# Patient Record
Sex: Female | Born: 2000 | Hispanic: Yes | Marital: Single | Smoking: Never smoker
Health system: Southern US, Community
[De-identification: ages and names within clinical notes are randomized; demographics above are authoritative.]

## PROBLEM LIST (undated history)

## (undated) ENCOUNTER — Inpatient Hospital Stay (HOSPITAL_COMMUNITY): Payer: Self-pay

## (undated) ENCOUNTER — Emergency Department (HOSPITAL_COMMUNITY): Admission: EM | Payer: Self-pay | Source: Home / Self Care

## (undated) DIAGNOSIS — T7840XA Allergy, unspecified, initial encounter: Secondary | ICD-10-CM

## (undated) DIAGNOSIS — N390 Urinary tract infection, site not specified: Secondary | ICD-10-CM

---
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
## 2020-01-14 ENCOUNTER — Emergency Department (HOSPITAL_COMMUNITY): Payer: Self-pay

## 2020-01-14 ENCOUNTER — Emergency Department (HOSPITAL_COMMUNITY)
Admission: EM | Admit: 2020-01-14 | Discharge: 2020-01-14 | Disposition: A | Discharge status: Home / Self Care | Payer: Self-pay | Attending: Emergency Medicine | Admitting: Emergency Medicine

## 2020-01-14 ENCOUNTER — Other Ambulatory Visit: Payer: Self-pay

## 2020-01-14 DIAGNOSIS — Z79899 Other long term (current) drug therapy: Secondary | ICD-10-CM | POA: Insufficient documentation

## 2020-01-14 DIAGNOSIS — S0083XA Contusion of other part of head, initial encounter: Secondary | ICD-10-CM

## 2020-01-14 DIAGNOSIS — Y939 Activity, unspecified: Secondary | ICD-10-CM | POA: Insufficient documentation

## 2020-01-14 DIAGNOSIS — Y999 Unspecified external cause status: Secondary | ICD-10-CM | POA: Insufficient documentation

## 2020-01-14 DIAGNOSIS — Y92009 Unspecified place in unspecified non-institutional (private) residence as the place of occurrence of the external cause: Secondary | ICD-10-CM | POA: Insufficient documentation

## 2020-01-14 MED ORDER — IBUPROFEN 800 MG PO TABS: 800.0000 mg | ORAL_TABLET | Freq: Three times a day (TID) | ORAL | 0 refills | Status: DC

## 2020-01-14 MED ORDER — KETOROLAC TROMETHAMINE 60 MG/2ML IM SOLN
60.0000 mg | Freq: Once | INTRAMUSCULAR | Status: AC
Start: 2020-01-14 — End: 2020-01-14
  Administered 2020-01-14: 60 mg via INTRAMUSCULAR
  Filled 2020-01-14: qty 2
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
## 2020-10-25 ENCOUNTER — Other Ambulatory Visit: Payer: Self-pay

## 2020-10-25 DIAGNOSIS — Z20822 Contact with and (suspected) exposure to covid-19: Secondary | ICD-10-CM

## 2020-10-26 LAB — NOVEL CORONAVIRUS, NAA: SARS-CoV-2, NAA: NOT DETECTED

## 2020-10-26 LAB — SARS-COV-2, NAA 2 DAY TAT

## 2020-11-24 ENCOUNTER — Ambulatory Visit: Payer: Self-pay

## 2020-11-24 DIAGNOSIS — Z20822 Contact with and (suspected) exposure to covid-19: Secondary | ICD-10-CM

## 2020-11-25 LAB — SARS-COV-2, NAA 2 DAY TAT

## 2020-11-25 LAB — NOVEL CORONAVIRUS, NAA: SARS-CoV-2, NAA: NOT DETECTED

## 2020-12-27 ENCOUNTER — Emergency Department (HOSPITAL_COMMUNITY)
Admission: EM | Admit: 2020-12-27 | Discharge: 2020-12-27 | Disposition: A | Discharge status: Home / Self Care | Payer: HRSA Program | Attending: Emergency Medicine | Admitting: Emergency Medicine

## 2020-12-27 ENCOUNTER — Other Ambulatory Visit: Payer: Self-pay

## 2020-12-27 ENCOUNTER — Encounter (HOSPITAL_COMMUNITY): Payer: Self-pay

## 2020-12-27 ENCOUNTER — Emergency Department (HOSPITAL_COMMUNITY)
Admission: EM | Admit: 2020-12-27 | Discharge: 2020-12-27 | Disposition: A | Discharge status: Home / Self Care | Payer: Self-pay | Attending: Emergency Medicine | Admitting: Emergency Medicine

## 2020-12-27 DIAGNOSIS — R6883 Chills (without fever): Secondary | ICD-10-CM | POA: Insufficient documentation

## 2020-12-27 DIAGNOSIS — Z20822 Contact with and (suspected) exposure to covid-19: Secondary | ICD-10-CM | POA: Diagnosis not present

## 2020-12-27 DIAGNOSIS — J029 Acute pharyngitis, unspecified: Secondary | ICD-10-CM | POA: Diagnosis present

## 2020-12-27 DIAGNOSIS — Z87440 Personal history of urinary (tract) infections: Secondary | ICD-10-CM | POA: Insufficient documentation

## 2020-12-27 DIAGNOSIS — R070 Pain in throat: Secondary | ICD-10-CM | POA: Insufficient documentation

## 2020-12-27 DIAGNOSIS — R519 Headache, unspecified: Secondary | ICD-10-CM | POA: Insufficient documentation

## 2020-12-27 DIAGNOSIS — R0789 Other chest pain: Secondary | ICD-10-CM | POA: Diagnosis not present

## 2020-12-27 DIAGNOSIS — R059 Cough, unspecified: Secondary | ICD-10-CM | POA: Insufficient documentation

## 2020-12-27 DIAGNOSIS — Z5321 Procedure and treatment not carried out due to patient leaving prior to being seen by health care provider: Secondary | ICD-10-CM | POA: Insufficient documentation

## 2020-12-27 LAB — SARS CORONAVIRUS 2 (TAT 6-24 HRS): SARS Coronavirus 2: NEGATIVE

## 2020-12-27 MED ORDER — ALBUTEROL SULFATE HFA 108 (90 BASE) MCG/ACT IN AERS
2.0000 | INHALATION_SPRAY | Freq: Once | RESPIRATORY_TRACT | Status: AC
  Administered 2020-12-27: 2 via RESPIRATORY_TRACT
  Filled 2020-12-27: qty 6.7

## 2020-12-27 NOTE — ED Triage Notes (Signed)
Patient has had a headache, sore throat since last night. Endorses dizziness with change of position. Denies body aches or cough, N/V/D.

## 2020-12-27 NOTE — Discharge Instructions (Addendum)
Your COVID-19 test should result sometime tomorrow afternoon.  Until then you must isolate.  If your COVID-19 test is positive that he will have to isolate for 5 days, and he can enter the public with a mask for another 5 days.  Take Tylenol and Motrin for your headaches. Use the albuterol inhaler for chest tightness. Return to the ER if your chest pain, shortness of breath gets worse.  Return to the ER if you start any confusion, loss of consciousness.
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
## 2020-12-27 NOTE — ED Triage Notes (Signed)
Pt reports headache, sore throat for 3 days. No covid vaccines

## 2020-12-27 NOTE — ED Notes (Signed)
Pt called for bed x3 with no response. 

## 2020-12-28 ENCOUNTER — Emergency Department (HOSPITAL_COMMUNITY): Payer: HRSA Program

## 2020-12-28 ENCOUNTER — Emergency Department (HOSPITAL_COMMUNITY)
Admission: EM | Admit: 2020-12-28 | Discharge: 2020-12-28 | Disposition: A | Discharge status: Home / Self Care | Payer: HRSA Program | Attending: Emergency Medicine | Admitting: Emergency Medicine

## 2020-12-28 ENCOUNTER — Other Ambulatory Visit: Payer: Self-pay

## 2020-12-28 ENCOUNTER — Encounter (HOSPITAL_COMMUNITY): Payer: Self-pay | Admitting: Emergency Medicine

## 2020-12-28 DIAGNOSIS — R509 Fever, unspecified: Secondary | ICD-10-CM | POA: Insufficient documentation

## 2020-12-28 DIAGNOSIS — R059 Cough, unspecified: Secondary | ICD-10-CM | POA: Insufficient documentation

## 2020-12-28 DIAGNOSIS — Z8669 Personal history of other diseases of the nervous system and sense organs: Secondary | ICD-10-CM | POA: Insufficient documentation

## 2020-12-28 DIAGNOSIS — R072 Precordial pain: Secondary | ICD-10-CM | POA: Insufficient documentation

## 2020-12-28 DIAGNOSIS — R000 Tachycardia, unspecified: Secondary | ICD-10-CM | POA: Insufficient documentation

## 2020-12-28 DIAGNOSIS — Z20822 Contact with and (suspected) exposure to covid-19: Secondary | ICD-10-CM | POA: Diagnosis not present

## 2020-12-28 DIAGNOSIS — R519 Headache, unspecified: Secondary | ICD-10-CM | POA: Diagnosis not present

## 2020-12-28 LAB — I-STAT BETA HCG BLOOD, ED (MC, WL, AP ONLY): I-stat hCG, quantitative: <5 m[IU]/mL (ref <5)

## 2020-12-28 LAB — BASIC METABOLIC PANEL
Anion gap: 11 (ref 5–15)
BUN: 8 mg/dL (ref 6–20)
CO2: 23 mmol/L (ref 22–32)
Calcium: 8.7 mg/dL — ABNORMAL LOW (ref 8.9–10.3)
Chloride: 104 mmol/L (ref 98–111)
Creatinine, Ser: 0.83 mg/dL (ref 0.44–1.00)
GFR, Estimated: >60 mL/min (ref >60)
Glucose, Bld: 109 mg/dL — ABNORMAL HIGH (ref 70–99)
Potassium: 3.5 mmol/L (ref 3.5–5.1)
Sodium: 138 mmol/L (ref 135–145)

## 2020-12-28 LAB — CBC WITH DIFFERENTIAL/PLATELET
Abs Immature Granulocytes: 0.01 10*3/uL (ref 0.00–0.07)
Basophils Absolute: 0 10*3/uL (ref 0.0–0.1)
Basophils Relative: 0 %
Eosinophils Absolute: 0.1 10*3/uL (ref 0.0–0.5)
Eosinophils Relative: 1 %
HCT: 37.8 % (ref 36.0–46.0)
Hemoglobin: 13.1 g/dL (ref 12.0–15.0)
Immature Granulocytes: 0 %
Lymphocytes Relative: 12 %
Lymphs Abs: 0.7 10*3/uL (ref 0.7–4.0)
MCH: 30.5 pg (ref 26.0–34.0)
MCHC: 34.7 g/dL (ref 30.0–36.0)
MCV: 88.1 fL (ref 80.0–100.0)
Monocytes Absolute: 0.8 10*3/uL (ref 0.1–1.0)
Monocytes Relative: 13 %
Neutro Abs: 4.2 10*3/uL (ref 1.7–7.7)
Neutrophils Relative %: 74 %
Platelets: 248 10*3/uL (ref 150–400)
RBC: 4.29 MIL/uL (ref 3.87–5.11)
RDW: 12.9 % (ref 11.5–15.5)
WBC: 5.8 10*3/uL (ref 4.0–10.5)
nRBC: 0 % (ref 0.0–0.2)

## 2020-12-28 LAB — SARS CORONAVIRUS 2 (TAT 6-24 HRS)
SARS Coronavirus 2: NEGATIVE
SARS Coronavirus 2: NEGATIVE

## 2020-12-28 LAB — TROPONIN I (HIGH SENSITIVITY): Troponin I (High Sensitivity): <2 ng/L (ref <18)

## 2020-12-28 MED ORDER — KETOROLAC TROMETHAMINE 30 MG/ML IJ SOLN
INTRAMUSCULAR | Status: AC
  Administered 2020-12-28: 60 mg via INTRAMUSCULAR
  Filled 2020-12-28: qty 1

## 2020-12-28 MED ORDER — DEXAMETHASONE SODIUM PHOSPHATE 10 MG/ML IJ SOLN
10.0000 mg | Freq: Once | INTRAMUSCULAR | Status: AC
  Administered 2020-12-28: 10 mg via INTRAVENOUS
  Filled 2020-12-28: qty 1

## 2020-12-28 MED ORDER — KETOROLAC TROMETHAMINE 30 MG/ML IJ SOLN
60.0000 mg | Freq: Once | INTRAMUSCULAR | Status: AC
  Filled 2020-12-28: qty 2

## 2020-12-28 MED ORDER — SODIUM CHLORIDE 0.9 % IV BOLUS
1000.0000 mL | Freq: Once | INTRAVENOUS | Status: AC
  Administered 2020-12-28: 1000 mL via INTRAVENOUS

## 2020-12-28 NOTE — ED Triage Notes (Signed)
Per [facility], Pt, from home, c/o headache x1 day and chest pain w/ movement and coughing starting this morning.  Pain score 9/10.    Pt was seen yesterday for same.  COVID-.

## 2020-12-28 NOTE — ED Notes (Addendum)
On 1/18, pt reports feeling "migrane and a fever." Sx progressively worsened since then. Returned to ED today because "when I tried to get up this morning, my chest hurt, like I could feel it. And, I had a fever last night of 103. And, my migraine hasn't gone away since the 18th." The last time she took tylenol 500mg  was 1/19 at 12 PM. Pt is not COVID-19 vaccinated. States she plans to get the vaccine.

## 2020-12-28 NOTE — Discharge Instructions (Addendum)
You have been tested for Covid.  Your symptoms could be related to Covid infection.  We recommend that you self isolate and quarantine until you receive the results of this test.  Treat yourself symptomatically, you may take an alternate Tylenol and ibuprofen for fever/headache control.  Stay well-hydrated.   If you have any worsening symptoms, severe chest pain, difficulty breathing or further concerns for your health please return to the emergency department for further evaluation.

## 2020-12-28 NOTE — ED Provider Notes (Signed)
[facility]-EMERGENCY DEPT Provider Note   CSN: [id number] Arrival date & time: 12/28/20  7672     History Chief Complaint  Patient presents with  . Chest Pain  . Headache    Alexandria [name] is a 20 y.o. female.  HPI   20 year old female with past medical history of migraines presents the emergency department with concern for general illness.  Patient states 2 days ago she developed fever, headache, dry cough.  She did have a recent exposure with a COVID-positive person.  Patient was seen here yesterday for generalized illness, initial swab for COVID was negative.  Patient returns today with ongoing symptoms, fever and cough.  Patient states she got a migraine at the beginning of the symptoms and it has not gone away, she is also not taking any medication for the headache.  Patient took 1 dose of Tylenol yesterday but has not been consistently treating herself symptomatically.  Denies any vomiting/diarrhea/swelling of her lower extremities.  Patient states when she has coughing spells she does get some midsternal chest tightness but otherwise denies any chest pain, shortness of breath, difficulty breathing.  Past Medical History:  Diagnosis Date  . Allergy   . Urinary tract infection     Patient Active Problem List   Diagnosis Date Noted  . Cesarean delivery delivered 04/01/2018  . Engagement of fetus in breech position 03/30/2018  . Rupture of membranes with delay of delivery 03/30/2018  . Breech birth 03/30/2018    Past Surgical History:  Procedure Laterality Date  . CESAREAN SECTION N/A 03/30/2018   Procedure: CESAREAN SECTION;  Surgeon: Conan Bowens, MD;  Location: [facility] BIRTHING SUITES;  Service: Obstetrics;  Laterality: N/A;     OB History    Gravida  1   Para  1   Term  1   Preterm      AB      Living  1     SAB      IAB      Ectopic      Multiple  0   Live Births  1           History reviewed. No pertinent family  history.  Social History   Tobacco Use  . Smoking status: Never Smoker  . Smokeless tobacco: Never Used  Vaping Use  . Vaping Use: Never used  Substance Use Topics  . Alcohol use: No  . Drug use: No    Home Medications Prior to Admission medications   Medication Sig Start Date End Date Taking? Authorizing Provider  acetaminophen (TYLENOL) 500 MG tablet Take 500 mg by mouth every 6 (six) hours as needed for moderate pain.    [provider]  hydrocortisone (ANUSOL-HC) 2.5 % rectal cream Apply rectally 2 times daily Patient not taking: No sig reported 05/18/18   Eustace Moore, MD  ibuprofen (ADVIL) 800 MG tablet Take 1 tablet (800 mg total) by mouth 3 (three) times daily. Patient not taking: No sig reported 01/14/20   Arby Barrette, MD    Allergies    Other  Review of Systems   Review of Systems  Constitutional: Positive for fatigue and fever. Negative for chills.  HENT: Positive for congestion.   Eyes: Negative for visual disturbance.  Respiratory: Positive for cough and chest tightness. Negative for shortness of breath.   Cardiovascular: Negative for chest pain, palpitations and leg swelling.  Gastrointestinal: Negative for abdominal pain, diarrhea and vomiting.  Genitourinary: Negative for dysuria.  Skin: Negative for rash.  Neurological: Positive for headaches.    Physical Exam Updated Vital Signs BP 117/79   Pulse (!) 104   Temp 100 F (37.8 C) (Oral)   Resp 17   Ht 4\' 11"  (1.499 m)   Wt 59 kg   SpO2 100%   BMI 26.26 kg/m   Physical Exam Vitals and nursing note reviewed.  Constitutional:      General: She is not in acute distress.    Appearance: Normal appearance. She is not ill-appearing, toxic-appearing or diaphoretic.  HENT:     Head: Normocephalic.     Mouth/Throat:     Mouth: Mucous membranes are moist.  Cardiovascular:     Rate and Rhythm: Tachycardia present.  Pulmonary:     Effort: Accessory muscle usage present. No tachypnea or  respiratory distress.     Breath sounds: No decreased breath sounds, wheezing or rales.  Chest:     Chest wall: No tenderness or crepitus.  Abdominal:     Palpations: Abdomen is soft.     Tenderness: There is no abdominal tenderness.  Musculoskeletal:     Right lower leg: No edema.     Left lower leg: No edema.  Skin:    General: Skin is warm.  Neurological:     Mental Status: She is alert and oriented to person, place, and time. Mental status is at baseline.  Psychiatric:        Mood and Affect: Mood normal.     ED Results / Procedures / Treatments   Labs (all labs ordered are listed, but only abnormal results are displayed) Labs Reviewed  CBC WITH DIFFERENTIAL/PLATELET  BASIC METABOLIC PANEL  I-STAT BETA HCG BLOOD, ED ([facility], [facility], [facility] ONLY)  TROPONIN I (HIGH SENSITIVITY)    EKG None  Radiology No results found.  Procedures Procedures (including critical care time)  Medications Ordered in ED Medications  ketorolac (TORADOL) 30 MG/ML injection 60 mg (has no administration in time range)  dexamethasone (DECADRON) injection 10 mg (has no administration in time range)  sodium chloride 0.9 % bolus 1,000 mL (has no administration in time range)    ED Course  I have reviewed the triage vital signs and the nursing notes.  Pertinent labs & imaging results that were available during my care of the patient were reviewed by me and considered in my medical decision making (see chart for details).    MDM Rules/Calculators/A&P                          20 year old female presents the emergency department with day 2 of generalized illness.  COVID swab from yesterday was negative, will repeat today because high suspicion for COVID.  Patient is febrile and tachycardic on arrival.  EKG shows sinus tachycardia, no ischemic changes.  No active chest pain or respiratory distress.  Main complaint is ongoing migraine and fever, has not taken any over-the-counter medication.  We will treat  symptomatically, evaluate with labs, hydrate.  Low suspicion for PE at this time given no hypoxia or active chest pain, tachycardia most likely related to fever.  After fluids and medication patient feels significantly improved.  Fever has reduced.  Blood work shows no acute abnormality, cardiac work-up is negative.  Low suspicion for ischemic cardiac disease, low suspicion for PE.  She is speaking in prolonged sentences, appears very comfortable.  Encouraged patient to treat herself symptomatically with over-the-counter medication.  Patient will be discharged and  treated as an outpatient.  Discharge plan and strict return to ED precautions discussed, patient verbalizes understanding and agreement.  Final Clinical Impression(s) / ED Diagnoses Final diagnoses:  None    Rx / DC Orders ED Discharge Orders    None       Rozelle Logan, DO 12/28/20 1341

## 2020-12-30 ENCOUNTER — Encounter (HOSPITAL_COMMUNITY): Payer: Self-pay

## 2020-12-30 ENCOUNTER — Other Ambulatory Visit: Payer: Self-pay

## 2020-12-30 ENCOUNTER — Emergency Department (HOSPITAL_COMMUNITY)
Admission: EM | Admit: 2020-12-30 | Discharge: 2020-12-30 | Disposition: A | Discharge status: Home / Self Care | Payer: Self-pay | Attending: Emergency Medicine | Admitting: Emergency Medicine

## 2020-12-30 DIAGNOSIS — G43909 Migraine, unspecified, not intractable, without status migrainosus: Secondary | ICD-10-CM | POA: Insufficient documentation

## 2020-12-30 DIAGNOSIS — Z5321 Procedure and treatment not carried out due to patient leaving prior to being seen by health care provider: Secondary | ICD-10-CM | POA: Insufficient documentation

## 2020-12-30 MED ORDER — ACETAMINOPHEN 325 MG PO TABS
650.0000 mg | ORAL_TABLET | Freq: Once | ORAL | Status: AC | PRN
  Administered 2020-12-30: 650 mg via ORAL
  Filled 2020-12-30: qty 2

## 2020-12-30 NOTE — ED Triage Notes (Signed)
Per EMS- Patient c/o migraine since yesterday. Patient was seen in the ED yesterday. EMS called at 0100 today, but patient was not transported. Patient c/o the same.

## 2021-02-22 ENCOUNTER — Encounter (HOSPITAL_BASED_OUTPATIENT_CLINIC_OR_DEPARTMENT_OTHER): Payer: Self-pay

## 2021-04-18 ENCOUNTER — Other Ambulatory Visit: Payer: Self-pay

## 2021-04-18 ENCOUNTER — Emergency Department (HOSPITAL_COMMUNITY)
Admission: EM | Admit: 2021-04-18 | Discharge: 2021-04-18 | Disposition: A | Discharge status: Home / Self Care | Payer: Self-pay | Attending: Emergency Medicine | Admitting: Emergency Medicine

## 2021-04-18 ENCOUNTER — Emergency Department (HOSPITAL_COMMUNITY): Payer: Self-pay

## 2021-04-18 ENCOUNTER — Encounter (HOSPITAL_COMMUNITY): Payer: Self-pay | Admitting: Emergency Medicine

## 2021-04-18 DIAGNOSIS — R110 Nausea: Secondary | ICD-10-CM | POA: Insufficient documentation

## 2021-04-18 DIAGNOSIS — R102 Pelvic and perineal pain: Secondary | ICD-10-CM | POA: Insufficient documentation

## 2021-04-18 LAB — URINALYSIS, ROUTINE W REFLEX MICROSCOPIC
Bacteria, UA: NONE SEEN
Bilirubin Urine: NEGATIVE
Glucose, UA: NEGATIVE mg/dL
Ketones, ur: NEGATIVE mg/dL
Leukocytes,Ua: NEGATIVE
Nitrite: NEGATIVE
Protein, ur: NEGATIVE mg/dL
Specific Gravity, Urine: 1.015 (ref 1.005–1.030)
pH: 5 (ref 5.0–8.0)

## 2021-04-18 LAB — COMPREHENSIVE METABOLIC PANEL
ALT: 11 U/L (ref 0–44)
AST: 17 U/L (ref 15–41)
Albumin: 4.2 g/dL (ref 3.5–5.0)
Alkaline Phosphatase: 66 U/L (ref 38–126)
Anion gap: 7 (ref 5–15)
BUN: 10 mg/dL (ref 6–20)
CO2: 26 mmol/L (ref 22–32)
Calcium: 8.7 mg/dL — ABNORMAL LOW (ref 8.9–10.3)
Chloride: 107 mmol/L (ref 98–111)
Creatinine, Ser: 0.7 mg/dL (ref 0.44–1.00)
GFR, Estimated: >60 mL/min (ref >60)
Glucose, Bld: 85 mg/dL (ref 70–99)
Potassium: 3.7 mmol/L (ref 3.5–5.1)
Sodium: 140 mmol/L (ref 135–145)
Total Bilirubin: 0.5 mg/dL (ref 0.3–1.2)
Total Protein: 6.4 g/dL — ABNORMAL LOW (ref 6.5–8.1)

## 2021-04-18 LAB — CBC
HCT: 38.3 % (ref 36.0–46.0)
Hemoglobin: 13.1 g/dL (ref 12.0–15.0)
MCH: 30.8 pg (ref 26.0–34.0)
MCHC: 34.2 g/dL (ref 30.0–36.0)
MCV: 89.9 fL (ref 80.0–100.0)
Platelets: 310 10*3/uL (ref 150–400)
RBC: 4.26 MIL/uL (ref 3.87–5.11)
RDW: 12.5 % (ref 11.5–15.5)
WBC: 7.2 10*3/uL (ref 4.0–10.5)
nRBC: 0 % (ref 0.0–0.2)

## 2021-04-18 LAB — WET PREP, GENITAL
Clue Cells Wet Prep HPF POC: NONE SEEN
Sperm: NONE SEEN
Trich, Wet Prep: NONE SEEN
Yeast Wet Prep HPF POC: NONE SEEN

## 2021-04-18 LAB — GC/CHLAMYDIA PROBE AMP (~~LOC~~) NOT AT ARMC
Chlamydia: NEGATIVE
Comment: NEGATIVE
Comment: NORMAL
Neisseria Gonorrhea: NEGATIVE

## 2021-04-18 LAB — I-STAT BETA HCG BLOOD, ED (MC, WL, AP ONLY): I-stat hCG, quantitative: <5 m[IU]/mL (ref <5)

## 2021-04-18 LAB — LIPASE, BLOOD: Lipase: 36 U/L (ref 11–51)

## 2021-04-18 MED ORDER — ONDANSETRON HCL 4 MG/2ML IJ SOLN
4.0000 mg | Freq: Once | INTRAMUSCULAR | Status: AC
  Administered 2021-04-18: 4 mg via INTRAVENOUS
  Filled 2021-04-18: qty 2

## 2021-04-18 MED ORDER — NAPROXEN 500 MG PO TABS: 500.0000 mg | ORAL_TABLET | Freq: Two times a day (BID) | ORAL | 0 refills | Status: DC | PRN

## 2021-04-18 MED ORDER — ONDANSETRON 4 MG PO TBDP: 4.0000 mg | ORAL_TABLET | Freq: Three times a day (TID) | ORAL | 0 refills | Status: DC | PRN

## 2021-04-18 MED ORDER — SODIUM CHLORIDE 0.9 % IV BOLUS
1000.0000 mL | Freq: Once | INTRAVENOUS | Status: AC
Start: 2021-04-18 — End: 2021-04-18
  Administered 2021-04-18: 1000 mL via INTRAVENOUS

## 2021-04-18 MED ORDER — KETOROLAC TROMETHAMINE 15 MG/ML IJ SOLN
15.0000 mg | Freq: Once | INTRAMUSCULAR | Status: AC
  Administered 2021-04-18: 15 mg via INTRAVENOUS
  Filled 2021-04-18: qty 1

## 2021-04-18 NOTE — ED Notes (Signed)
Patient verbalizes understanding of discharge instructions. Prescriptions reviewed. Opportunity for questioning and answers were provided. Armband removed by staff, pt discharged from ED ambulatory. ° °

## 2021-04-18 NOTE — ED Provider Notes (Signed)
[facility] EMERGENCY DEPARTMENT Provider Note   CSN: [id number] Arrival date & time: 04/18/21  0025     History Chief Complaint  Patient presents with  . Abdominal Pain    Alexandria [name] is a 20 y.o. female with a hx of prior c-section who presents to the ED with complaints of abdominal pain x 3 days. Patient reports intermittent crampy pain to the left pelvic region, no alleviating/aggravating factors, has had associated nausea & vaginal spotting. Has IUD in place and typically does not have bleeding. Denies fever, emesis, dysuria, vaginal discharge, diarrhea, or concern for STI. In a monogamous relationship.   HPI     Past Medical History:  Diagnosis Date  . Allergy   . Urinary tract infection     Patient Active Problem List   Diagnosis Date Noted  . Cesarean delivery delivered 04/01/2018  . Engagement of fetus in breech position 03/30/2018  . Rupture of membranes with delay of delivery 03/30/2018  . Breech birth 03/30/2018    Past Surgical History:  Procedure Laterality Date  . CESAREAN SECTION N/A 03/30/2018   Procedure: CESAREAN SECTION;  Surgeon: Conan Bowens, MD;  Location: [facility] BIRTHING SUITES;  Service: Obstetrics;  Laterality: N/A;     OB History    Gravida  1   Para  1   Term  1   Preterm      AB      Living  1     SAB      IAB      Ectopic      Multiple  0   Live Births  1           Family History  Problem Relation Age of Onset  . Healthy Mother   . Healthy Father     Social History   Tobacco Use  . Smoking status: Never Smoker  . Smokeless tobacco: Never Used  Vaping Use  . Vaping Use: Never used  Substance Use Topics  . Alcohol use: No  . Drug use: No    Home Medications Prior to Admission medications   Medication Sig Start Date End Date Taking? Authorizing Provider  acetaminophen (TYLENOL) 500 MG tablet Take 500 mg by mouth every 6 (six) hours as needed for moderate pain.    [provider]  hydrocortisone (ANUSOL-HC) 2.5 % rectal cream Apply rectally 2 times daily Patient not taking: No sig reported 05/18/18   Eustace Moore, MD  ibuprofen (ADVIL) 800 MG tablet Take 1 tablet (800 mg total) by mouth 3 (three) times daily. Patient not taking: No sig reported 01/14/20   Arby Barrette, MD    Allergies    Other  Review of Systems   Review of Systems  Constitutional: Negative for chills and fever.  Respiratory: Negative for shortness of breath.   Cardiovascular: Negative for chest pain.  Gastrointestinal: Positive for abdominal pain and nausea. Negative for constipation, diarrhea and vomiting.  Genitourinary: Positive for vaginal bleeding. Negative for dysuria and vaginal discharge.  Neurological: Negative for syncope.  All other systems reviewed and are negative.   Physical Exam Updated Vital Signs BP (!) 96/59 (BP Location: Left Arm)   Pulse 80   Temp 98 F (36.7 C) (Oral)   Resp 16   SpO2 98%   Physical Exam Vitals and nursing note reviewed.  Constitutional:      General: She is not in acute distress.    Appearance: She is well-developed. She is not  toxic-appearing.  HENT:     Head: Normocephalic and atraumatic.  Eyes:     General:        Right eye: No discharge.        Left eye: No discharge.     Conjunctiva/sclera: Conjunctivae normal.  Cardiovascular:     Rate and Rhythm: Normal rate and regular rhythm.  Pulmonary:     Effort: Pulmonary effort is normal. No respiratory distress.     Breath sounds: Normal breath sounds. No wheezing, rhonchi or rales.  Abdominal:     General: There is no distension.     Palpations: Abdomen is soft.     Tenderness: There is abdominal tenderness (mild left sided. ) in the suprapubic area.  Genitourinary:    Comments: Chaperone present- Jeri NT.  No external lesions.  IUD strings @ cervical os. Mild bloody discharge present. No clots. L adnexal tenderness wo palpable masses. No CMT Musculoskeletal:      Cervical back: Neck supple.  Skin:    General: Skin is warm and dry.     Findings: No rash.  Neurological:     Mental Status: She is alert.     Comments: Clear speech.   Psychiatric:        Behavior: Behavior normal.     ED Results / Procedures / Treatments   Labs (all labs ordered are listed, but only abnormal results are displayed) Labs Reviewed  URINALYSIS, ROUTINE W REFLEX MICROSCOPIC - Abnormal; Notable for the following components:      Result Value   APPearance HAZY (*)    Hgb urine dipstick LARGE (*)    All other components within normal limits  CBC  LIPASE, BLOOD  COMPREHENSIVE METABOLIC PANEL  I-STAT BETA HCG BLOOD, ED ([facility], [facility], [facility] ONLY)    EKG None  Radiology US Pelvis Complete  Result Date: 04/18/2021 CLINICAL DATA:  Initial evaluation for acute left pelvic pain. EXAM: TRANSABDOMINAL ULTRASOUND OF PELVIS DOPPLER ULTRASOUND OF OVARIES TECHNIQUE: Transabdominal ultrasound examination of the pelvis was performed including evaluation of the uterus, ovaries, adnexal regions, and pelvic cul-de-sac. Color and duplex Doppler ultrasound was utilized to evaluate blood flow to the ovaries. COMPARISON:  None. FINDINGS: Uterus Measurements: 6.0 x 2.7 x 5.5 cm = volume: 63.4 mL. No fibroids or other mass visualized. Endometrium Thickness: 5.2 mm. No focal abnormality visualized. IUD in place, grossly in normal position within the endometrial cavity. Right ovary Measurements: 3.4 x 2.2 x 2.5 cm = volume: 10.1 mL. Normal appearance/no adnexal mass. Left ovary Measurements: 5.0 x 2.6 x 2.7 cm = volume: 18.1 mL. Normal appearance/no adnexal mass. Pulsed Doppler evaluation demonstrates normal low-resistance arterial and venous waveforms in both ovaries. Other: Trace free fluid within the pelvis, presumably physiologic. IMPRESSION: 1. Trace free fluid within the pelvis, presumably physiologic. 2. Otherwise unremarkable and normal pelvic ultrasound. No evidence for torsion or other acute  abnormality. 3. IUD in place within the endometrial cavity. Electronically Signed   By: Rise MuBenjamin  McClintock M.D.   On: 04/18/2021 03:20   US Art/Ven Flow Abd Pelv Doppler  Result Date: 04/18/2021 CLINICAL DATA:  Initial evaluation for acute left pelvic pain. EXAM: TRANSABDOMINAL ULTRASOUND OF PELVIS DOPPLER ULTRASOUND OF OVARIES TECHNIQUE: Transabdominal ultrasound examination of the pelvis was performed including evaluation of the uterus, ovaries, adnexal regions, and pelvic cul-de-sac. Color and duplex Doppler ultrasound was utilized to evaluate blood flow to the ovaries. COMPARISON:  None. FINDINGS: Uterus Measurements: 6.0 x 2.7 x 5.5 cm = volume: 63.4 mL. No fibroids  or other mass visualized. Endometrium Thickness: 5.2 mm. No focal abnormality visualized. IUD in place, grossly in normal position within the endometrial cavity. Right ovary Measurements: 3.4 x 2.2 x 2.5 cm = volume: 10.1 mL. Normal appearance/no adnexal mass. Left ovary Measurements: 5.0 x 2.6 x 2.7 cm = volume: 18.1 mL. Normal appearance/no adnexal mass. Pulsed Doppler evaluation demonstrates normal low-resistance arterial and venous waveforms in both ovaries. Other: Trace free fluid within the pelvis, presumably physiologic. IMPRESSION: 1. Trace free fluid within the pelvis, presumably physiologic. 2. Otherwise unremarkable and normal pelvic ultrasound. No evidence for torsion or other acute abnormality. 3. IUD in place within the endometrial cavity. Electronically Signed   By: Rise Mu M.D.   On: 04/18/2021 03:20    Procedures Procedures   Medications Ordered in ED Medications - No data to display  ED Course  I have reviewed the triage vital signs and the nursing notes.  Pertinent labs & imaging results that were available during my care of the patient were reviewed by me and considered in my medical decision making (see chart for details).    MDM Rules/Calculators/A&P                         Patient presents  to the ED with complaints of pelvic pain. Patient nontoxic appearing, in no apparent distress, vitals without significant abnormality, BP mildly soft- appropriate MAP & also otherwise fairly healthy young female w/ similar Bps previously. On exam patient tender to the suprapubic region and the left adnexa, no peritoneal signs.   Additional history obtained:  Additional history obtained from chart review & nursing note review.   Lab Tests:  I Ordered, reviewed, and interpreted labs, which included:  CBC: unremarkable.  CMP: mild hypocalcemia.  Lipase: WNL UA: Blood present, no UTI Preg test: Negative Wet prep: No bv, yeast or trich.   Imaging Studies ordered:  I ordered imaging studies which included pelvic US, I independently reviewed, formal radiology impression shows:  1. Trace free fluid within the pelvis, presumably physiologic. 2. Otherwise unremarkable and normal pelvic ultrasound. No evidence for torsion or other acute abnormality. 3. IUD in place within the endometrial cavity.  ED Course:  On repeat abdominal exam patient remains without peritoneal signs, low suspicion for cholecystitis, pancreatitis, diverticulitis, appendicitis, bowel obstruction/perforation or other acute surgical process. L suprapubic tenderness & adnexal tenderness, [place] without torsion, preg test negative- doubt ectopic, no concern for STI by patient- no leukocytosis/fever- doubt PID. Reassuring work-up/exam. Patient feeling improved & tolerating PO. Will tx supportively w/ OBGYN follow up. I discussed results, treatment plan, need for follow-up, and return precautions with the patient. Provided opportunity for questions, patient confirmed understanding and is in agreement with plan.   Blood pressure 101/65, pulse 65, temperature 98.4 F (36.9 C), temperature source Oral, resp. rate 20, SpO2 100 %, unknown if currently breastfeeding.  Portions of this note were generated with Scientist, clinical (histocompatibility and immunogenetics). Dictation  errors may occur despite best attempts at proofreading.   Final Clinical Impression(s) / ED Diagnoses Final diagnoses:  Pelvic pain    Rx / DC Orders ED Discharge Orders         Ordered    ondansetron (ZOFRAN ODT) 4 MG disintegrating tablet  Every 8 hours PRN        04/18/21 0359    naproxen (NAPROSYN) 500 MG tablet  2 times daily PRN        04/18/21 0359  Desmond Lope 04/18/21 0435    Zadie Rhine, MD 04/18/21 [phone]

## 2021-04-18 NOTE — ED Triage Notes (Signed)
Pt c/o lower abdominal pain, nausea and abnormal vaginal bleeding that started this morning.

## 2021-04-18 NOTE — Discharge Instructions (Addendum)
You were seen in the emergency department today for abdominal pain.  Your labs and imaging were overall reassuring.  Your IUD appears to be in the correct location.  We are sending you home with the following medicines to help with your symptoms:   - Naproxen is a nonsteroidal anti-inflammatory medication that will help with pain and swelling. Be sure to take this medication as prescribed with food, 1 pill every 12 hours,  It should be taken with food, as it can cause stomach upset, and more seriously, stomach bleeding. Do not take other nonsteroidal anti-inflammatory medications with this such as Advil, Motrin, Aleve, Mobic, Goodie Powder, or Motrin.    - Zofran-take this every 8 hours as needed for nausea/vomiting.  You make take Tylenol per over the counter dosing with these medications.   We have prescribed you new medication(s) today. Discuss the medications prescribed today with your pharmacist as they can have adverse effects and interactions with your other medicines including over the counter and prescribed medications. Seek medical evaluation if you start to experience new or abnormal symptoms after taking one of these medicines, seek care immediately if you start to experience difficulty breathing, feeling of your throat closing, facial swelling, or rash as these could be indications of a more serious allergic reaction  Follow-up with OB/GYN within 3 days.  Return to the ER for new or worsening symptoms including but not limited to new or worsening pain, fever, passing large blood clots, dizziness, lightheadedness, passing out, inability to keep fluids down, or any other concerns.

## 2021-05-01 ENCOUNTER — Other Ambulatory Visit: Payer: Self-pay

## 2021-05-01 ENCOUNTER — Emergency Department (HOSPITAL_COMMUNITY)
Admission: EM | Admit: 2021-05-01 | Discharge: 2021-05-01 | Disposition: A | Discharge status: Home / Self Care | Payer: Self-pay | Attending: Emergency Medicine | Admitting: Emergency Medicine

## 2021-05-01 DIAGNOSIS — Z20822 Contact with and (suspected) exposure to covid-19: Secondary | ICD-10-CM | POA: Insufficient documentation

## 2021-05-01 DIAGNOSIS — J029 Acute pharyngitis, unspecified: Secondary | ICD-10-CM | POA: Insufficient documentation

## 2021-05-01 DIAGNOSIS — H9209 Otalgia, unspecified ear: Secondary | ICD-10-CM | POA: Insufficient documentation

## 2021-05-01 LAB — GROUP A STREP BY PCR: Group A Strep by PCR: NOT DETECTED

## 2021-05-01 LAB — SARS CORONAVIRUS 2 (TAT 6-24 HRS): SARS Coronavirus 2: NEGATIVE

## 2021-05-01 NOTE — Discharge Instructions (Addendum)
The Covid test is pending at time of discharge.  Instructions on how to follow this up on my chart are on your discharge paperwork, you can also call the department if you are having trouble finding these results.  If he/she is Covid positive he/she will need to be quarantine for total 5 days since the onset of symptoms +24 hours of no fever and resolving symptoms, additionally he/she needs to wear a mask near all others for 5 more days. If he/she is not Covid positive he/she is able to go back to normal day-to-day routine as long as he/she is not having fevers and it has been 24 hours since his/her last fever.  You can take 600 mg of ibuprofen every 6 hours, you can take 1000 mg of Tylenol every 6 hours, you can alternate these every 3 or you can take them together.  I also would like you to start taking a daily antihistamine such as Claritin or Zyrtec.  This can be bought over-the-counter.  Finally for the eye draining I would do warm compress once a day at least especially in the morning when left discharge is the most.  This is likely secondary to conjunctivitis either allergic or viral.  Finally, I need you to start following up with primary care for routine screenings and primary care needs.  Information for this is provided towards the back of this packet

## 2021-05-01 NOTE — ED Triage Notes (Signed)
Pt reports 3 days of sore throat, ear pain, discharge from her eyes. Reports subjective fever at home. VSS.

## 2021-05-01 NOTE — ED Notes (Signed)
Pt has guardian listed but no number is available to contact guardian.

## 2021-05-01 NOTE — ED Provider Notes (Signed)
[facility] EMERGENCY DEPARTMENT Provider Note   CSN: [id number] Arrival date & time: 05/01/21  1150     History Chief Complaint  Patient presents with  . Sore Throat  . Otalgia    Alexandria [name] is a 20 y.o. female.   Sore Throat This is a new problem. The current episode started 2 days ago. The problem occurs constantly. The problem has not changed since onset.Pertinent negatives include no chest pain, no abdominal pain, no headaches and no shortness of breath. The symptoms are aggravated by swallowing and eating. Nothing relieves the symptoms. She has tried acetaminophen for the symptoms. The treatment provided mild relief.       Past Medical History:  Diagnosis Date  . Allergy   . Urinary tract infection     Patient Active Problem List   Diagnosis Date Noted  . Cesarean delivery delivered 04/01/2018  . Engagement of fetus in breech position 03/30/2018  . Rupture of membranes with delay of delivery 03/30/2018  . Breech birth 03/30/2018    Past Surgical History:  Procedure Laterality Date  . CESAREAN SECTION N/A 03/30/2018   Procedure: CESAREAN SECTION;  Surgeon: Conan Bowens, MD;  Location: [facility] BIRTHING SUITES;  Service: Obstetrics;  Laterality: N/A;     OB History    Gravida  1   Para  1   Term  1   Preterm      AB      Living  1     SAB      IAB      Ectopic      Multiple  0   Live Births  1           Family History  Problem Relation Age of Onset  . Healthy Mother   . Healthy Father     Social History   Tobacco Use  . Smoking status: Never Smoker  . Smokeless tobacco: Never Used  Vaping Use  . Vaping Use: Never used  Substance Use Topics  . Alcohol use: No  . Drug use: No    Home Medications Prior to Admission medications   Medication Sig Start Date End Date Taking? Authorizing Provider  acetaminophen (TYLENOL) 500 MG tablet Take 500 mg by mouth every 6 (six) hours as needed for moderate pain.     [provider]  hydrocortisone (ANUSOL-HC) 2.5 % rectal cream Apply rectally 2 times daily Patient not taking: No sig reported 05/18/18   Eustace Moore, MD  ibuprofen (ADVIL) 800 MG tablet Take 1 tablet (800 mg total) by mouth 3 (three) times daily. Patient not taking: No sig reported 01/14/20   Arby Barrette, MD  naproxen (NAPROSYN) 500 MG tablet Take 1 tablet (500 mg total) by mouth 2 (two) times daily as needed for moderate pain. 04/18/21   Petrucelli, Samantha R, PA-C  ondansetron (ZOFRAN ODT) 4 MG disintegrating tablet Take 1 tablet (4 mg total) by mouth every 8 (eight) hours as needed for nausea or vomiting. 04/18/21   Petrucelli, Samantha R, PA-C    Allergies    Other  Review of Systems   Review of Systems  Constitutional: Negative for chills and fever.  HENT: Positive for congestion, rhinorrhea and sore throat.   Eyes: Positive for discharge.  Respiratory: Negative for cough and shortness of breath.   Cardiovascular: Negative for chest pain and palpitations.  Gastrointestinal: Negative for abdominal pain, diarrhea, nausea and vomiting.  Genitourinary: Negative for difficulty urinating and dysuria.  Musculoskeletal:  Negative for arthralgias and back pain.  Skin: Negative for rash and wound.  Neurological: Negative for light-headedness and headaches.    Physical Exam Updated Vital Signs BP 107/67 (BP Location: Right Arm)   Pulse 81   Temp 98.9 F (37.2 C)   Resp 14   Ht 4\' 11"  (1.499 m)   Wt 54.4 kg   SpO2 99%   BMI 24.24 kg/m   Physical Exam Vitals and nursing note reviewed. Exam conducted with a chaperone present.  Constitutional:      General: She is not in acute distress.    Appearance: Normal appearance.  HENT:     Head: Normocephalic and atraumatic.     Nose: No rhinorrhea.     Mouth/Throat:     Pharynx: Posterior oropharyngeal erythema present.     Tonsils: No tonsillar exudate or tonsillar abscesses. 2+ on the right. 2+ on the left.  Eyes:      General:        Right eye: No discharge.        Left eye: No discharge.     Conjunctiva/sclera: Conjunctivae normal.  Neck:     Thyroid: No thyromegaly.  Cardiovascular:     Rate and Rhythm: Normal rate and regular rhythm.  Pulmonary:     Effort: Pulmonary effort is normal. No respiratory distress.     Breath sounds: No stridor.  Abdominal:     General: Abdomen is flat. There is no distension.     Palpations: Abdomen is soft.  Musculoskeletal:        General: No tenderness or signs of injury.     Cervical back: Normal range of motion.  Lymphadenopathy:     Cervical: No cervical adenopathy.  Skin:    General: Skin is warm and dry.  Neurological:     General: No focal deficit present.     Mental Status: She is alert. Mental status is at baseline.     Motor: No weakness.  Psychiatric:        Mood and Affect: Mood normal.        Behavior: Behavior normal.     ED Results / Procedures / Treatments   Labs (all labs ordered are listed, but only abnormal results are displayed) Labs Reviewed  GROUP A STREP BY PCR  SARS CORONAVIRUS 2 (TAT 6-24 HRS)    EKG None  Radiology No results found.  Procedures Procedures   Medications Ordered in ED Medications - No data to display  ED Course  I have reviewed the triage vital signs and the nursing notes.  Pertinent labs & imaging results that were available during my care of the patient were reviewed by me and considered in my medical decision making (see chart for details).    MDM Rules/Calculators/A&P                          Symptoms of sore throat likely allergic versus viral however will screen for strep pharyngitis as the patient claims to have had subjective fever.  Likely just needing supportive care.  COVID test sent as well. Strep neg, DC home with supportive care and return precautions Final Clinical Impression(s) / ED Diagnoses Final diagnoses:  Pharyngitis, unspecified etiology    Rx / DC Orders ED  Discharge Orders    None       , MD 05/01/21 1410

## 2021-11-16 IMAGING — DX DG CHEST 1V PORT
1 series · 1 of 1 positions shown · non-contrast
Comparison: None.

CLINICAL DATA: Cough.  Chest pain.  COVID.

EXAM:
PORTABLE CHEST 1 VIEW

[chest ap]
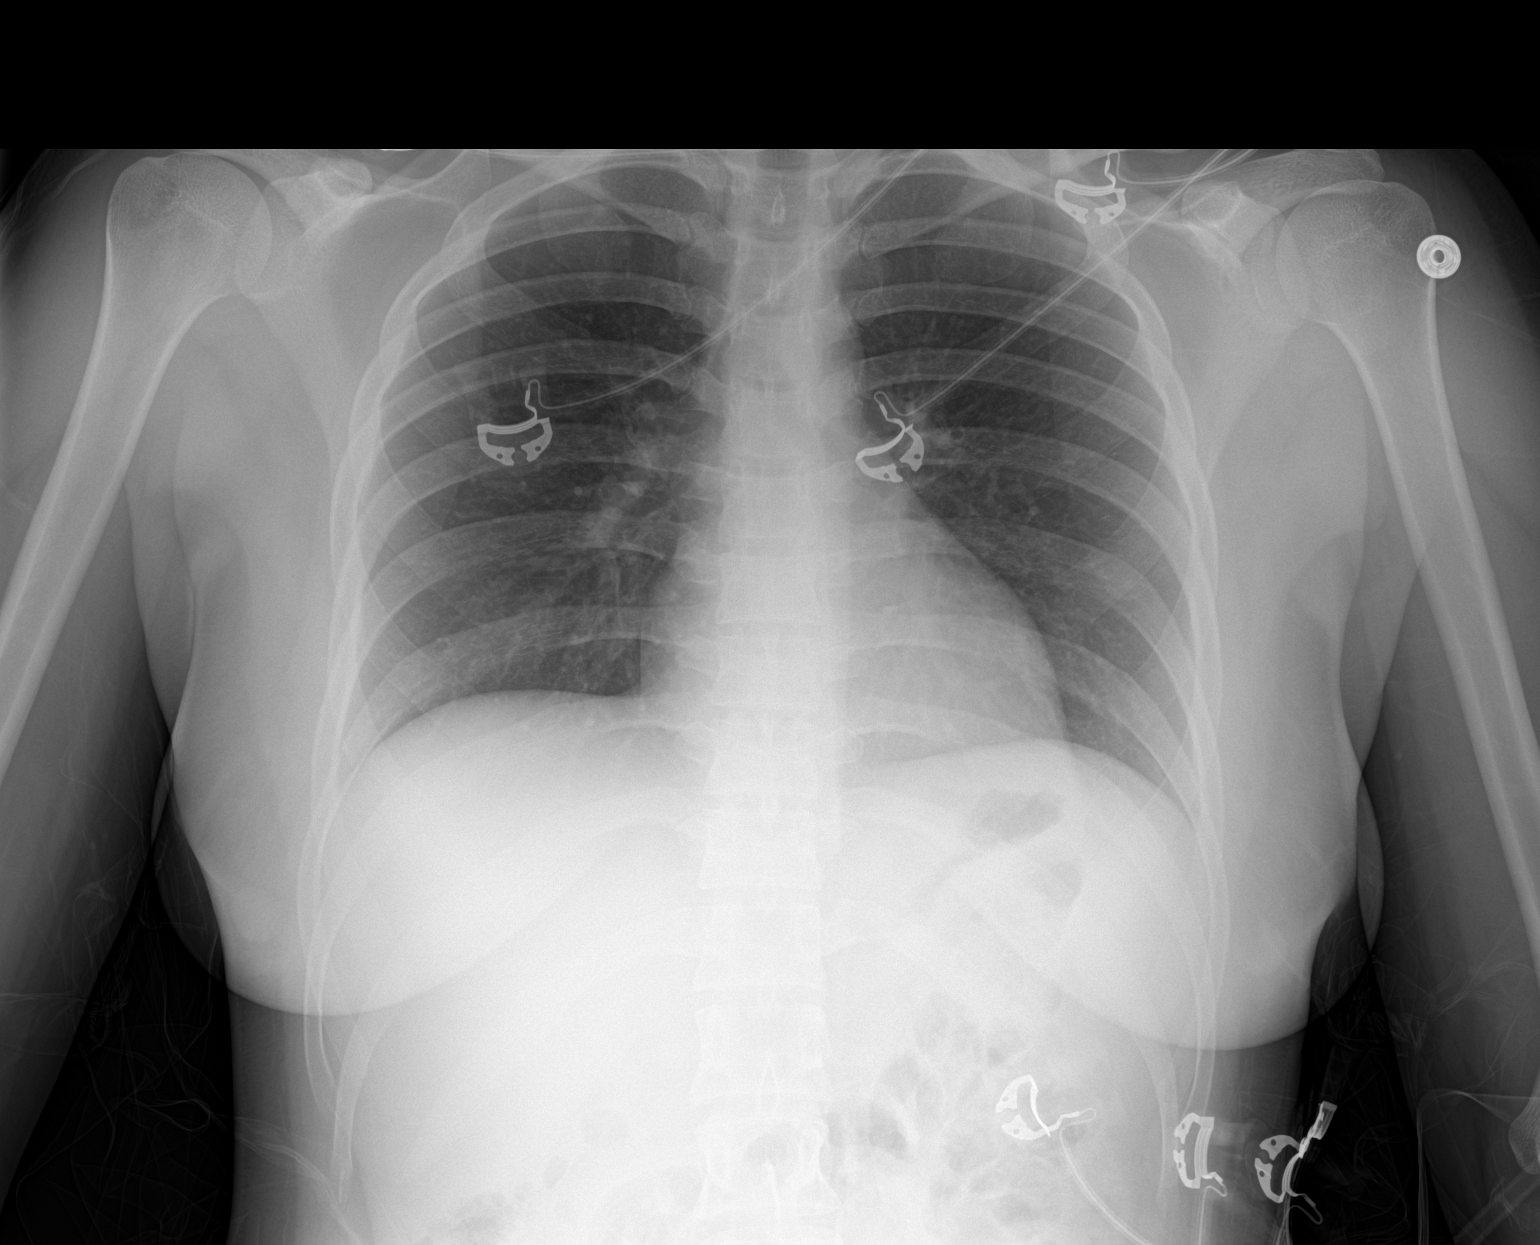

[1 of 1 positions shown; findings below may reference images not displayed]

FINDINGS: The heart size and mediastinal contours are within normal limits.
Both lungs are clear. No visible pleural effusions or pneumothorax.
No acute osseous abnormality.
IMPRESSION: No active disease.

## 2022-09-10 ENCOUNTER — Encounter (HOSPITAL_COMMUNITY): Payer: Self-pay | Admitting: Emergency Medicine

## 2022-09-10 ENCOUNTER — Emergency Department (HOSPITAL_COMMUNITY): Payer: Self-pay

## 2022-09-10 ENCOUNTER — Ambulatory Visit (HOSPITAL_COMMUNITY)
Admission: EM | Admit: 2022-09-10 | Discharge: 2022-09-10 | Disposition: A | Discharge status: Home / Self Care | Payer: Self-pay | Attending: Family Medicine | Admitting: Family Medicine

## 2022-09-10 ENCOUNTER — Other Ambulatory Visit: Payer: Self-pay

## 2022-09-10 ENCOUNTER — Emergency Department (HOSPITAL_COMMUNITY)
Admission: EM | Admit: 2022-09-10 | Discharge: 2022-09-10 | Disposition: A | Discharge status: Home / Self Care | Payer: Self-pay | Attending: Emergency Medicine | Admitting: Emergency Medicine

## 2022-09-10 ENCOUNTER — Encounter (HOSPITAL_COMMUNITY): Payer: Self-pay

## 2022-09-10 ENCOUNTER — Emergency Department (HOSPITAL_COMMUNITY)
Admission: EM | Admit: 2022-09-10 | Discharge: 2022-09-10 | Discharge status: Against Medical Advice | Payer: Self-pay | Attending: Medical | Admitting: Medical

## 2022-09-10 DIAGNOSIS — R109 Unspecified abdominal pain: Secondary | ICD-10-CM

## 2022-09-10 DIAGNOSIS — N12 Tubulo-interstitial nephritis, not specified as acute or chronic: Secondary | ICD-10-CM | POA: Insufficient documentation

## 2022-09-10 DIAGNOSIS — Z5321 Procedure and treatment not carried out due to patient leaving prior to being seen by health care provider: Secondary | ICD-10-CM | POA: Insufficient documentation

## 2022-09-10 DIAGNOSIS — N10 Acute tubulo-interstitial nephritis: Secondary | ICD-10-CM

## 2022-09-10 DIAGNOSIS — R1031 Right lower quadrant pain: Secondary | ICD-10-CM | POA: Insufficient documentation

## 2022-09-10 DIAGNOSIS — R339 Retention of urine, unspecified: Secondary | ICD-10-CM | POA: Insufficient documentation

## 2022-09-10 LAB — CBC WITH DIFFERENTIAL/PLATELET
Abs Immature Granulocytes: 0.04 10*3/uL (ref 0.00–0.07)
Basophils Absolute: 0 10*3/uL (ref 0.0–0.1)
Basophils Relative: 0 %
Eosinophils Absolute: 0.1 10*3/uL (ref 0.0–0.5)
Eosinophils Relative: 1 %
HCT: 39.1 % (ref 36.0–46.0)
Hemoglobin: 13.6 g/dL (ref 12.0–15.0)
Immature Granulocytes: 0 %
Lymphocytes Relative: 5 %
Lymphs Abs: 0.7 10*3/uL (ref 0.7–4.0)
MCH: 30.4 pg (ref 26.0–34.0)
MCHC: 34.8 g/dL (ref 30.0–36.0)
MCV: 87.5 fL (ref 80.0–100.0)
Monocytes Absolute: 1.1 10*3/uL — ABNORMAL HIGH (ref 0.1–1.0)
Monocytes Relative: 8 %
Neutro Abs: 11.9 10*3/uL — ABNORMAL HIGH (ref 1.7–7.7)
Neutrophils Relative %: 86 %
Platelets: 270 10*3/uL (ref 150–400)
RBC: 4.47 MIL/uL (ref 3.87–5.11)
RDW: 12.3 % (ref 11.5–15.5)
WBC: 13.7 10*3/uL — ABNORMAL HIGH (ref 4.0–10.5)
nRBC: 0 % (ref 0.0–0.2)

## 2022-09-10 LAB — COMPREHENSIVE METABOLIC PANEL
ALT: 22 U/L (ref 0–44)
AST: 23 U/L (ref 15–41)
Albumin: 4.8 g/dL (ref 3.5–5.0)
Alkaline Phosphatase: 76 U/L (ref 38–126)
Anion gap: 8 (ref 5–15)
BUN: 10 mg/dL (ref 6–20)
CO2: 26 mmol/L (ref 22–32)
Calcium: 9.4 mg/dL (ref 8.9–10.3)
Chloride: 107 mmol/L (ref 98–111)
Creatinine, Ser: 0.58 mg/dL (ref 0.44–1.00)
GFR, Estimated: >60 mL/min (ref >60)
Glucose, Bld: 113 mg/dL — ABNORMAL HIGH (ref 70–99)
Potassium: 3.7 mmol/L (ref 3.5–5.1)
Sodium: 141 mmol/L (ref 135–145)
Total Bilirubin: 0.9 mg/dL (ref 0.3–1.2)
Total Protein: 7.5 g/dL (ref 6.5–8.1)

## 2022-09-10 LAB — URINALYSIS, ROUTINE W REFLEX MICROSCOPIC
Bilirubin Urine: NEGATIVE
Glucose, UA: NEGATIVE mg/dL
Ketones, ur: NEGATIVE mg/dL
Nitrite: POSITIVE — AB
Protein, ur: NEGATIVE mg/dL
Specific Gravity, Urine: 1.01 (ref 1.005–1.030)
WBC, UA: >50 WBC/hpf — ABNORMAL HIGH (ref 0–5)
pH: 5 (ref 5.0–8.0)

## 2022-09-10 LAB — I-STAT BETA HCG BLOOD, ED (MC, WL, AP ONLY): I-stat hCG, quantitative: <5 m[IU]/mL (ref <5)

## 2022-09-10 LAB — LACTIC ACID, PLASMA
Lactic Acid, Venous: 1.2 mmol/L (ref 0.5–1.9)
Lactic Acid, Venous: 0.6 mmol/L (ref 0.5–1.9)

## 2022-09-10 LAB — LIPASE, BLOOD: Lipase: 26 U/L (ref 11–51)

## 2022-09-10 MED ORDER — SULFAMETHOXAZOLE-TRIMETHOPRIM 800-160 MG PO TABS: 1.0000 | ORAL_TABLET | Freq: Two times a day (BID) | ORAL | 0 refills | Status: AC

## 2022-09-10 MED ORDER — IOHEXOL 300 MG/ML  SOLN: 100.0000 mL | Freq: Once | INTRAMUSCULAR | Status: DC | PRN

## 2022-09-10 MED ORDER — HYDROMORPHONE HCL 1 MG/ML IJ SOLN
1.0000 mg | Freq: Once | INTRAMUSCULAR | Status: AC
  Administered 2022-09-10: 1 mg via INTRAVENOUS
  Filled 2022-09-10: qty 1

## 2022-09-10 MED ORDER — LACTATED RINGERS IV SOLN: INTRAVENOUS | Status: DC

## 2022-09-10 MED ORDER — LACTATED RINGERS IV BOLUS: 1000.0000 mL | Freq: Once | INTRAVENOUS | Status: DC

## 2022-09-10 MED ORDER — LACTATED RINGERS IV BOLUS (SEPSIS): 1000.0000 mL | Freq: Once | INTRAVENOUS | Status: DC

## 2022-09-10 MED ORDER — SODIUM CHLORIDE 0.9 % IV SOLN
2.0000 g | Freq: Once | INTRAVENOUS | Status: AC
  Administered 2022-09-10: 2 g via INTRAVENOUS
  Filled 2022-09-10: qty 20

## 2022-09-10 MED ORDER — SODIUM CHLORIDE 0.9 % IV BOLUS
1000.0000 mL | Freq: Once | INTRAVENOUS | Status: AC
  Administered 2022-09-10: 1000 mL via INTRAVENOUS

## 2022-09-10 MED ORDER — SODIUM CHLORIDE 0.9 % IV SOLN: 2.0000 g | INTRAVENOUS | Status: DC

## 2022-09-10 MED ORDER — ACETAMINOPHEN 500 MG PO TABS
1000.0000 mg | ORAL_TABLET | Freq: Once | ORAL | Status: AC
  Administered 2022-09-10: 1000 mg via ORAL
  Filled 2022-09-10: qty 2

## 2022-09-10 NOTE — Progress Notes (Signed)
Elink following code sepsis °

## 2022-09-10 NOTE — ED Triage Notes (Addendum)
Constant r sided flank pain starting at 0400. Also endorses bad headache and nausea. LMP 2 weeks ago. States she feels like she cannot fully void and has to urinate frequently.

## 2022-09-10 NOTE — ED Notes (Signed)
Unable to locate pt in lobby for blood work

## 2022-09-10 NOTE — ED Triage Notes (Signed)
Per EMS- patient c/o RLQ, right flank, and urinary retention that started this AM.

## 2022-09-10 NOTE — ED Notes (Signed)
Patient is being discharged from the Urgent Care and sent to the Emergency Department via POV . Per Vanessa Kick, MD, patient is in need of higher level of care due to higher level of care needed. Patient is aware and verbalizes understanding of plan of care.  Vitals:   09/10/22 1753  BP: 104/67  Pulse: (!) 119  Resp: 19  Temp: 99.9 F (37.7 C)  SpO2: 98%

## 2022-09-10 NOTE — ED Provider Triage Note (Signed)
Emergency Medicine Provider Triage Evaluation Note  Alexandria [name] , a 21 y.o. female  was evaluated in triage.  Pt complains of right flank pain and urinary retention onset this morning.  Reports history of UTI in the past feels similar today.  Patient reports feeling warm at home she is not taken any antipyretics and has not measured a fever.  Patient reports she finished her menstrual cycle 2 days ago.  Review of Systems  Positive: Right flank pain, urinary retention Negative: Fever, chills, vomiting, diarrhea, dysuria or chance of pregnancy.  Physical Exam  BP 106/73 (BP Location: Right Arm)   Pulse (!) 130   Temp 100.1 F (37.8 C) (Oral)   Resp 18   LMP  (Within Days)   SpO2 97%  Gen:   Awake, no distress   Resp:  Normal effort  MSK:   Moves extremities without difficulty  Other:  Right CVA tenderness  Medical Decision Making  Medically screening exam initiated at 12:48 PM.  Appropriate orders placed.  Alexandria D Melburn Hake was informed that the remainder of the evaluation will be completed by another provider, this initial triage assessment does not replace that evaluation, and the importance of remaining in the ED until their evaluation is complete.  Risk versus benefits of CT imaging discussed with patient, patient elected proceed with CT renal stone study.  Note: Portions of this report may have been transcribed using voice recognition software. Every effort was made to ensure accuracy; however, inadvertent computerized transcription errors may still be present.    Deliah Boston, PA-C 09/10/22 1251

## 2022-09-10 NOTE — Progress Notes (Signed)
Notified bedside nurse of need to draw lactic acid and blood cultures.  

## 2022-09-10 NOTE — Progress Notes (Signed)
Notified bedside nurse of need to administer antibiotics and fluid bolus.  

## 2022-09-10 NOTE — Discharge Instructions (Signed)
It looks like you have a kidney infection.  Please start taking antibiotics at home. If you develop fevers at home you can take tylenol.  If you develop any worsening symptoms, please return to the emergency department

## 2022-09-10 NOTE — ED Provider Notes (Signed)
  Romney    ASSESSMENT & PLAN:  1. Right flank pain   2. Acute pyelonephritis    Cannot r/o nephrolithiasis in addition to pyelonephritis.This was discussed with her at length. Cr from ED WNL. WBC slightly elevated. Discussed IV fluids + antibiotic + overnight observation vs return to ED. She prefers to return to [facility] via POV. Stable upon d/c.  Outlined signs and symptoms indicating need for more acute intervention. Patient verbalized understanding. After Visit Summary given.  SUBJECTIVE:  Alexandria [name] is a 21 y.o. female who complains of urinary urgency, incomplete voiding, R flank pain; abrupt onset this morning. With assoc nausea. Seen in ED but left secondary to wait times. Here reports continuing R flank pain. Nausea is the same; no emesis. Flank pain worse with prolonged sitting. Is ambulatory without difficulty. Reports subjective fever.  LMP: Patient's last menstrual period was 09/05/2022.  OBJECTIVE:  Vitals:   09/10/22 1753  BP: 104/67  Pulse: (!) 119  Resp: 19  Temp: 99.9 F (37.7 C)  TempSrc: Oral  SpO2: 98%   Tachycardia noted.  General appearance: alert; no distress HENT: oropharynx: moist Lungs: unlabored respirations Abdomen: soft, non-tender Back: with R CVA tenderness Extremities: no edema; symmetrical with no gross deformities Skin: warm and dry Neurologic: normal gait Psychological: alert and cooperative; normal mood and affect  Labs Reviewed - No data to display  Allergies  Allergen Reactions   Other Swelling    cilantro    Past Medical History:  Diagnosis Date   Allergy    Urinary tract infection    Social History   Socioeconomic History   Marital status: Single    Spouse name: Not on file   Number of children: Not on file   Years of education: Not on file   Highest education level: Not on file  Occupational History   Not on file  Tobacco Use   Smoking status: Never   Smokeless tobacco: Never  Vaping Use    Vaping Use: Never used  Substance and Sexual Activity   Alcohol use: No   Drug use: No   Sexual activity: Yes  Other Topics Concern   Not on file  Social History Narrative   Not on file   Social Determinants of Health   Financial Resource Strain: Not on file  Food Insecurity: Not on file  Transportation Needs: Not on file  Physical Activity: Not on file  Stress: Not on file  Social Connections: Not on file  Intimate Partner Violence: Not on file   Family History  Problem Relation Age of Onset   Healthy Mother    Healthy Father         Vanessa Kick, MD 09/10/22 [phone]

## 2022-09-10 NOTE — ED Notes (Signed)
Needs IV for CT  

## 2022-09-10 NOTE — ED Triage Notes (Signed)
Pt c/o right flank pain that started early this morning with nausea. Reports having some retention as well when urinating.  Was seen at ED earlier and had blood work and urine test but wait was so long she left.  Pt reports in lots of pain still esp when sitting.

## 2022-09-10 NOTE — ED Provider Notes (Signed)
[facility]-EMERGENCY DEPT Provider Note   CSN: [id number] Arrival date & time: 09/10/22  1829     History PMH: n/a Chief Complaint  Patient presents with   Flank Pain    Alexandria [name] is a 21 y.o. female. Presenting to the ED with 1 day of 10/10 right flank and right lower quadrant abdominal pain.  She had associated fevers today.  She also reports difficulty getting urine out and does not feel like she is emptying her bladder. She was seen here originally ended up leaving and went to urgent care.  She was found to be febrile and tachycardic.  She had a full work-up with labs concerning for positive nitrites, and bacteria as well as a mildly elevated leukocytosis.  She is continue to have pain without much relief.  She was sent over here again to get IV fluids and consideration of antibiotics.    Flank Pain Associated symptoms include abdominal pain.       Home Medications Prior to Admission medications   Medication Sig Start Date End Date Taking? Authorizing Provider  sulfamethoxazole-trimethoprim (BACTRIM DS) 800-160 MG tablet Take 1 tablet by mouth 2 (two) times daily for 14 days. 09/10/22 09/24/22 Yes Wilburt Messina, Finis Bud, PA-C  acetaminophen (TYLENOL) 500 MG tablet Take 500 mg by mouth every 6 (six) hours as needed for moderate pain.    [provider]  hydrocortisone (ANUSOL-HC) 2.5 % rectal cream Apply rectally 2 times daily Patient not taking: No sig reported 05/18/18   Eustace Moore, MD  ibuprofen (ADVIL) 800 MG tablet Take 1 tablet (800 mg total) by mouth 3 (three) times daily. Patient not taking: No sig reported 01/14/20   Arby Barrette, MD  naproxen (NAPROSYN) 500 MG tablet Take 1 tablet (500 mg total) by mouth 2 (two) times daily as needed for moderate pain. 04/18/21   Petrucelli, Samantha R, PA-C  ondansetron (ZOFRAN ODT) 4 MG disintegrating tablet Take 1 tablet (4 mg total) by mouth every 8 (eight) hours as needed for nausea or  vomiting. 04/18/21   Petrucelli, Pleas Koch, PA-C      Allergies    Other    Review of Systems   Review of Systems  Constitutional:  Positive for fever.  Gastrointestinal:  Positive for abdominal pain.  Genitourinary:  Positive for decreased urine volume and flank pain.  All other systems reviewed and are negative.   Physical Exam Updated Vital Signs BP 110/64   Pulse 97   Temp (!) 100.9 F (38.3 C) (Oral)   Resp 16   LMP 09/05/2022   SpO2 98%  Physical Exam Vitals and nursing note reviewed.  Constitutional:      General: She is not in acute distress.    Appearance: Normal appearance. She is well-developed. She is not ill-appearing, toxic-appearing or diaphoretic.  HENT:     Head: Normocephalic and atraumatic.     Nose: No nasal deformity.     Mouth/Throat:     Lips: Pink. No lesions.  Eyes:     General: Gaze aligned appropriately. No scleral icterus.       Right eye: No discharge.        Left eye: No discharge.     Conjunctiva/sclera: Conjunctivae normal.     Right eye: Right conjunctiva is not injected. No exudate or hemorrhage.    Left eye: Left conjunctiva is not injected. No exudate or hemorrhage. Pulmonary:     Effort: Pulmonary effort is normal. No respiratory distress.  Abdominal:  General: Abdomen is flat.     Palpations: Abdomen is soft.     Tenderness: There is abdominal tenderness. There is right CVA tenderness and guarding. There is no rebound.     Comments: + RLQ and R CVA tenderness, voluntary guarding  Skin:    General: Skin is warm and dry.  Neurological:     Mental Status: She is alert and oriented to person, place, and time.  Psychiatric:        Mood and Affect: Mood normal.        Speech: Speech normal.        Behavior: Behavior normal. Behavior is cooperative.     ED Results / Procedures / Treatments   Labs (all labs ordered are listed, but only abnormal results are displayed) Labs Reviewed  URINE CULTURE  LACTIC ACID, PLASMA   LACTIC ACID, PLASMA    EKG None  Radiology CT Renal Stone Study  Result Date: 09/10/2022 CLINICAL DATA:  Right flank pain with headache and nausea. EXAM: CT ABDOMEN AND PELVIS WITHOUT CONTRAST TECHNIQUE: Multidetector CT imaging of the abdomen and pelvis was performed following the standard protocol without IV contrast. RADIATION DOSE REDUCTION: This exam was performed according to the departmental dose-optimization program which includes automated exposure control, adjustment of the mA and/or kV according to patient size and/or use of iterative reconstruction technique. COMPARISON:  None Available. FINDINGS: Lower chest: No acute abnormality. Hepatobiliary: No focal liver abnormality is seen. No gallstones, gallbladder wall thickening, or biliary dilatation. Pancreas: Unremarkable. No pancreatic ductal dilatation or surrounding inflammatory changes. Spleen: Normal in size without focal abnormality. Adrenals/Urinary Tract: Adrenal glands are unremarkable. Kidneys are normal in size, without obstructing renal calculi, focal lesion, or hydronephrosis. Mild right-sided hydroureter is noted. Bladder is unremarkable. Stomach/Bowel: Stomach is within normal limits. Appendix appears normal. No evidence of bowel wall thickening, distention, or inflammatory changes. Vascular/Lymphatic: No significant vascular findings are present. No enlarged abdominal or pelvic lymph nodes. Reproductive: Uterus and bilateral adnexa are unremarkable. Other: No abdominal wall hernia or abnormality. No abdominopelvic ascites. Musculoskeletal: No acute or significant osseous findings. IMPRESSION: Mild right-sided hydroureter without evidence of obstructing renal calculi. This may represent sequelae associated with a recently passed renal calculus. Electronically Signed   By: Aram Candela M.D.   On: 09/10/2022 19:55    Procedures Procedures  This patient was on telemetry or cardiac monitoring during their time in the ED.     Medications Ordered in ED Medications  acetaminophen (TYLENOL) tablet 1,000 mg (1,000 mg Oral Given 09/10/22 1956)  sodium chloride 0.9 % bolus 1,000 mL (0 mLs Intravenous Stopped 09/10/22 2130)  cefTRIAXone (ROCEPHIN) 2 g in sodium chloride 0.9 % 100 mL IVPB (0 g Intravenous Stopped 09/10/22 2053)  HYDROmorphone (DILAUDID) injection 1 mg (1 mg Intravenous Given 09/10/22 2008)    ED Course/ Medical Decision Making/ A&P                           Medical Decision Making Amount and/or Complexity of Data Reviewed Labs: ordered. Radiology: ordered.  Risk OTC drugs. Prescription drug management.    MDM  This is a 21 y.o. female who presents to the ED with right flank/RLQ pain The differential of this patient includes but is not limited to pyelonephritis, UTI, ovarian torsion, ectopic pregnancy, ovarian cyst, appendicitis, PID.  Initial Impression  Febrile, tachycardic to 125 +RLQ tenderness  I reviewed prior labs from earlier today. She has a mild leukocytosis to  13.9, normal renal function, normal LFTs, Urine with large LE, + nitrites, and pregnancy negative.  I have added on a lactate. Also concerned for possible septic stone, urinary retention, or appendicitis. Planning to obtain bladder scan and CT stone study to evaluate.   I personally ordered, reviewed, and interpreted all laboratory work and imaging and agree with radiologist interpretation. Results interpreted below:  Lactate is normal Added on Urine culture which is pending.  CT renal stone study is negative for stone and appendicitis, but does show some hydroureter indicating a passed stone.   Assessment/Plan:  After fluids, tylenol, 2 g rocephin, and dilaudid, patient is feeling much better. Patient could have had an infected stone that has now passed, or findings could be indicative of an ascending UTI as well. She has stable vitals, appears well, and feels well enough for discharge. I think this is appropriate. Will  put her on Bactrim and have PCP follow up. Strict return precautions are provided.   Charting Requirements Additional history is obtained from:  Independent historian External Records from outside source obtained and reviewed including: Recent UC note Social Determinants of Health:  none Pertinant PMH that complicates patient's illness: n/a  Patient Care Problems that were addressed during this visit: - Pyelonephritis: Acute illness with complication This patient was maintained on a cardiac monitor/telemetry. I personally viewed and interpreted the cardiac monitor which reveals an underlying rhythm of NSR Medications given in ED: Dilaudid, IVF, rocephin, Tylenol Reevaluation of the patient after these medicines showed that the patient improved I have reviewed home medications and made changes accordingly.  Critical Care Interventions: n/a Consultations: n/a Disposition: discharge  Portions of this note were generated with Dragon dictation software. Dictation errors may occur despite best attempts at proofreading.   Final Clinical Impression(s) / ED Diagnoses Final diagnoses:  Pyelonephritis    Rx / DC Orders ED Discharge Orders          Ordered    sulfamethoxazole-trimethoprim (BACTRIM DS) 800-160 MG tablet  2 times daily        09/10/22 2128              Sheila Oats 09/10/22 2135    Carmin Muskrat, MD 09/10/22 2247

## 2022-09-13 LAB — URINE CULTURE: Culture: >=100000 — AB

## 2022-09-14 ENCOUNTER — Telehealth (HOSPITAL_BASED_OUTPATIENT_CLINIC_OR_DEPARTMENT_OTHER): Payer: Self-pay | Admitting: *Deleted

## 2022-09-14 NOTE — Progress Notes (Signed)
ED Antimicrobial Stewardship Positive Culture Follow Up   Alexandria [name] is an 21 y.o. female who presented to [facility] on 09/10/2022 with a chief complaint of  Chief Complaint  Patient presents with   Flank Pain    Recent Results (from the past 720 hour(s))  Urine Culture     Status: Abnormal   Collection Time: 09/10/22 12:48 PM   Specimen: Urine, Clean Catch  Result Value Ref Range Status   Specimen Description   Final    URINE, CLEAN CATCH Performed at [facility], [place] [address], [place], [place] [address]    Special Requests   Final    NONE Performed at [facility], [place] [address] [place], [place] [address]    Culture >=100,000 COLONIES/mL ESCHERICHIA COLI (A)  Final   Report Status 09/13/2022 FINAL  Final   Organism ID, Bacteria ESCHERICHIA COLI (A)  Final      Susceptibility   Escherichia coli - MIC*    AMPICILLIN >=32 RESISTANT Resistant     CEFAZOLIN 16 SENSITIVE Sensitive     CEFEPIME <=0.12 SENSITIVE Sensitive     CEFTRIAXONE <=0.25 SENSITIVE Sensitive     CIPROFLOXACIN <=0.25 SENSITIVE Sensitive     GENTAMICIN >=16 RESISTANT Resistant     IMIPENEM <=0.25 SENSITIVE Sensitive     NITROFURANTOIN <=16 SENSITIVE Sensitive     TRIMETH/SULFA >=320 RESISTANT Resistant     AMPICILLIN/SULBACTAM >=32 RESISTANT Resistant     PIP/TAZO <=4 SENSITIVE Sensitive     * >=100,000 COLONIES/mL ESCHERICHIA COLI    [x]  Treated with Sulfamethoxazole/Trimethoprim, organism resistant to prescribed antimicrobial []  Patient discharged originally without antimicrobial agent and treatment is now indicated  New antibiotic prescription: Cefadroxil 1000mg  (2 x 500mg  capsules) PO twice a day x 10 days  ED Provider: Dr. Linus Orn, Reine Just M 09/14/2022, 12:01 PM Clinical Pharmacist [phone]

## 2022-09-14 NOTE — Telephone Encounter (Signed)
Post ED Visit - Positive Culture Follow-up: Unsuccessful Patient Follow-up  Culture assessed and recommendations reviewed by:  [x]  Lindell Spar, Pharm.D. []  Heide Guile, Pharm.D., BCPS AQ-ID []  Parks Neptune, Pharm.D., BCPS []  Alycia Rossetti, Pharm.D., BCPS []  [place], [place].D., BCPS, AAHIVP []  Legrand Como, Pharm.D., BCPS, AAHIVP []  Wynell Balloon, PharmD []  Vincenza Hews, PharmD, BCPS  Positive urine culture  []  Patient discharged without antimicrobial prescription and treatment is now indicated [x]  Organism is resistant to prescribed ED discharge antimicrobial []  Patient with positive blood cultures  New Rx: Cefadroxil 1000mg  PO twice a day x 10 days.  Unable to contact patient, letter will be sent to address on file  Rosie Fate 09/14/2022, 12:21 PM

## 2022-11-10 ENCOUNTER — Ambulatory Visit (HOSPITAL_COMMUNITY): Admission: EM | Admit: 2022-11-10 | Discharge: 2022-11-10 | Discharge status: Against Medical Advice | Payer: Self-pay

## 2023-04-25 ENCOUNTER — Encounter (HOSPITAL_COMMUNITY): Payer: Self-pay

## 2023-04-25 ENCOUNTER — Emergency Department (HOSPITAL_COMMUNITY)
Admission: EM | Admit: 2023-04-25 | Discharge: 2023-04-26 | Disposition: A | Discharge status: Home / Self Care | Payer: Self-pay | Attending: Emergency Medicine | Admitting: Emergency Medicine

## 2023-04-25 ENCOUNTER — Emergency Department (HOSPITAL_COMMUNITY): Payer: Self-pay

## 2023-04-25 ENCOUNTER — Other Ambulatory Visit: Payer: Self-pay

## 2023-04-25 DIAGNOSIS — R1030 Lower abdominal pain, unspecified: Secondary | ICD-10-CM | POA: Insufficient documentation

## 2023-04-25 DIAGNOSIS — R000 Tachycardia, unspecified: Secondary | ICD-10-CM | POA: Insufficient documentation

## 2023-04-25 DIAGNOSIS — O039 Complete or unspecified spontaneous abortion without complication: Secondary | ICD-10-CM | POA: Insufficient documentation

## 2023-04-25 LAB — CBC WITH DIFFERENTIAL/PLATELET
Abs Immature Granulocytes: 0.08 10*3/uL — ABNORMAL HIGH (ref 0.00–0.07)
Basophils Absolute: 0.1 10*3/uL (ref 0.0–0.1)
Basophils Relative: 0 %
Eosinophils Absolute: 0.3 10*3/uL (ref 0.0–0.5)
Eosinophils Relative: 2 %
HCT: 38.1 % (ref 36.0–46.0)
Hemoglobin: 13 g/dL (ref 12.0–15.0)
Immature Granulocytes: 1 %
Lymphocytes Relative: 13 %
Lymphs Abs: 2.2 10*3/uL (ref 0.7–4.0)
MCH: 30.2 pg (ref 26.0–34.0)
MCHC: 34.1 g/dL (ref 30.0–36.0)
MCV: 88.6 fL (ref 80.0–100.0)
Monocytes Absolute: 0.9 10*3/uL (ref 0.1–1.0)
Monocytes Relative: 5 %
Neutro Abs: 13.9 10*3/uL — ABNORMAL HIGH (ref 1.7–7.7)
Neutrophils Relative %: 79 %
Platelets: 393 10*3/uL (ref 150–400)
RBC: 4.3 MIL/uL (ref 3.87–5.11)
RDW: 13.1 % (ref 11.5–15.5)
WBC: 17.5 10*3/uL — ABNORMAL HIGH (ref 4.0–10.5)
nRBC: 0 % (ref 0.0–0.2)

## 2023-04-25 LAB — HCG, QUANTITATIVE, PREGNANCY: hCG, Beta Chain, Quant, S: 62702 m[IU]/mL — ABNORMAL HIGH (ref <5)

## 2023-04-25 MED ORDER — MORPHINE SULFATE (PF) 4 MG/ML IV SOLN
4.0000 mg | Freq: Once | INTRAVENOUS | Status: AC
  Administered 2023-04-25: 4 mg via INTRAVENOUS
  Filled 2023-04-25: qty 1

## 2023-04-25 MED ORDER — LACTATED RINGERS IV BOLUS
1000.0000 mL | Freq: Once | INTRAVENOUS | Status: AC
  Administered 2023-04-25: 1000 mL via INTRAVENOUS

## 2023-04-25 MED ORDER — KETOROLAC TROMETHAMINE 15 MG/ML IJ SOLN
15.0000 mg | Freq: Once | INTRAMUSCULAR | Status: AC
  Administered 2023-04-25: 15 mg via INTRAVENOUS
  Filled 2023-04-25: qty 1

## 2023-04-25 MED ORDER — ONDANSETRON HCL 4 MG/2ML IJ SOLN
4.0000 mg | Freq: Once | INTRAMUSCULAR | Status: AC
  Administered 2023-04-25: 4 mg via INTRAVENOUS
  Filled 2023-04-25: qty 2

## 2023-04-25 NOTE — ED Triage Notes (Signed)
Pt to ED by POV from home with c/o vaginal bleeding. Pt is [redacted] weeks pregnant EDD 12/2. Pt endorses spotting which began a couple of hours ago, however this has progressed to heavy bleeding and abdominal pain. Arrives A+O, VSS.

## 2023-04-25 NOTE — ED Notes (Signed)
MD informed of pt's hypotension (81/53). New orders placed.

## 2023-04-25 NOTE — ED Provider Notes (Signed)
[place] EMERGENCY DEPARTMENT AT [facility] Provider Note   CSN: [id number] Arrival date & time: 04/25/23  2017     History {Add pertinent medical, surgical, social history, OB history to HPI:1} Chief Complaint  Patient presents with   Threatened Miscarriage    Alexandria [name] is a 22 y.o. female.  Patient is a G3 P1 with 1 live child and 1 miscarriage after her first child was born who is presenting today at [redacted] weeks pregnant who is already had confirmatory ultrasound by OB/GYN for abdominal pain and bleeding.  The cramping started approximately 2 hours ago and bleeding started an hour ago.  She has had very heavy bleeding with significant clot.  She is getting intermittent severe cramps.  Blood type is O+.  With her last miscarriage she did not require any intervention.  The history is provided by the patient.       Home Medications Prior to Admission medications   Medication Sig Start Date End Date Taking? Authorizing Provider  acetaminophen (TYLENOL) 500 MG tablet Take 500 mg by mouth every 6 (six) hours as needed for moderate pain.    [provider]  hydrocortisone (ANUSOL-HC) 2.5 % rectal cream Apply rectally 2 times daily Patient not taking: No sig reported 05/18/18   Eustace Moore, MD  ibuprofen (ADVIL) 800 MG tablet Take 1 tablet (800 mg total) by mouth 3 (three) times daily. Patient not taking: No sig reported 01/14/20   Arby Barrette, MD  naproxen (NAPROSYN) 500 MG tablet Take 1 tablet (500 mg total) by mouth 2 (two) times daily as needed for moderate pain. 04/18/21   Petrucelli, Samantha R, PA-C  ondansetron (ZOFRAN ODT) 4 MG disintegrating tablet Take 1 tablet (4 mg total) by mouth every 8 (eight) hours as needed for nausea or vomiting. 04/18/21   Petrucelli, Pleas Koch, PA-C      Allergies    Other    Review of Systems   Review of Systems  Physical Exam Updated Vital Signs BP 100/61   Pulse (!) 104   Temp 98.4 F (36.9  C) (Oral)   Resp (!) 24   Ht 4\' 11"  (1.499 m)   Wt 54.4 kg   LMP  (Exact Date)   SpO2 100%   BMI 24.24 kg/m  Physical Exam Vitals and nursing note reviewed.  Constitutional:      General: She is in acute distress.     Appearance: She is well-developed.     Comments: Peers uncomfortable  HENT:     Head: Normocephalic and atraumatic.  Eyes:     Pupils: Pupils are equal, round, and reactive to light.  Cardiovascular:     Rate and Rhythm: Regular rhythm. Tachycardia present.     Heart sounds: Normal heart sounds. No murmur heard.    No friction rub.  Pulmonary:     Effort: Pulmonary effort is normal.     Breath sounds: Normal breath sounds. No wheezing or rales.  Abdominal:     General: Bowel sounds are normal. There is no distension.     Palpations: Abdomen is soft.     Tenderness: There is abdominal tenderness in the suprapubic area. There is no guarding or rebound.  Genitourinary:    Comments: Large quantity of blood and clot.  When speculum is inserted and open blood running out onto the floor immediately.  Unable to visualize the cervix due to the extent of blood and clot present in the vault Musculoskeletal:  General: No tenderness. Normal range of motion.     Comments: No edema  Skin:    General: Skin is warm and dry.     Findings: No rash.  Neurological:     Mental Status: She is alert and oriented to person, place, and time.     Cranial Nerves: No cranial nerve deficit.  Psychiatric:        Behavior: Behavior normal.     ED Results / Procedures / Treatments   Labs (all labs ordered are listed, but only abnormal results are displayed) Labs Reviewed  CBC WITH DIFFERENTIAL/PLATELET - Abnormal; Notable for the following components:      Result Value   WBC 17.5 (*)    Neutro Abs 13.9 (*)    Abs Immature Granulocytes 0.08 (*)    All other components within normal limits  HCG, QUANTITATIVE, PREGNANCY - Abnormal; Notable for the following components:   hCG,  Beta Chain, Alexandria [name] 62,702 (*)    All other components within normal limits    EKG EKG Interpretation  Date/Time:  Friday Apr 25 2023 21:00:19 EDT Ventricular Rate:  101 PR Interval:  129 QRS Duration: 85 QT Interval:  340 QTC Calculation: 441 R Axis:   82 Text Interpretation: Sinus tachycardia Confirmed by Gwyneth Sprout ([id number]) on 04/25/2023 9:49:03 PM  Radiology US OB Comp < 14 Wks  Result Date: 04/25/2023 CLINICAL DATA:  Vaginal bleeding, pelvic pain. Pregnant, assigned gestational age [redacted] weeks, 3 days. EXAM: OBSTETRIC <14 WK ULTRASOUND TECHNIQUE: Transabdominal ultrasound was performed for evaluation of the gestation as well as the maternal uterus and adnexal regions. Transvaginal sonography was deferred by the patient. COMPARISON:  04/18/2021 FINDINGS: Intrauterine gestational sac: None identified Maternal uterus/adnexae: The uterus is anteverted. The uterus measures 7.6 x 6.0 x 15.3 cm in greatest dimension (volume 362 cc). As noted above, no intrauterine gestational sac is identified. There is asymmetric thickening of the endometrium which measures up to 24 mm in thickness within the lower uterine segment. The cervix is not optimally visualized via trans abdominal technique, however, appears closed. There is a large amount of echogenic heterogeneous avascular material within the distended proximal vaginal canal, best seen on cine images, likely representing blood product in the setting of a spontaneous abortion. There is no free fluid within the pelvis. The maternal ovaries are unremarkable. No adnexal masses are seen. IMPRESSION: No intrauterine gestational sac identified. Large volume complex material likely within the proximal vaginal canal suggestive of blood product in the setting of a spontaneous abortion. Correlation with serial beta HCG examination and follow-up sonography in 7 days is recommended to document completion. Electronically Signed   By: Helyn Numbers M.D.   On:  04/25/2023 22:55    Procedures Procedures  {Document cardiac monitor, telemetry assessment procedure when appropriate:1}  Medications Ordered in ED Medications  ketorolac (TORADOL) 15 MG/ML injection 15 mg (has no administration in time range)  morphine (PF) 4 MG/ML injection 4 mg (4 mg Intravenous Given 04/25/23 2116)  ondansetron (ZOFRAN) injection 4 mg (4 mg Intravenous Given 04/25/23 2115)  lactated ringers bolus 1,000 mL (1,000 mLs Intravenous New Bag/Given 04/25/23 2156)  morphine (PF) 4 MG/ML injection 4 mg (4 mg Intravenous Given 04/25/23 2236)    ED Course/ Medical Decision Making/ A&P   {   Click here for ABCD2, HEART and other calculatorsREFRESH Note before signing :1}  Medical Decision Making Amount and/or Complexity of Data Reviewed Labs: ordered. Radiology: ordered.  Risk Prescription drug management.   Pt presenting today with a complaint that caries a high risk for morbidity and mortality.  Here currently with significant vaginal bleeding at [redacted] weeks pregnant concern for miscarriage.  Patient has a large amount of liquid blood and clot in the vault and unable to visualize the cervix.  Heart rate as high as of 140 feel that some of this is related to pain as it does improve after patient received pain medication.  hCG, CBC and ultrasound are pending.  Patient's blood type is O+.  11:01 PM I independently interpreted patient's labs and CBC today with a hemoglobin of 13, white count of 17 but most likely acute phase reactant from bleeding and pain.  hCG 62,000.  I have independently visualized and interpreted pt's images today.  Pelvic ultrasound with no evidence of IUP.  Patient reports she had already had an ultrasound this pregnancy that confirmed an IUP so low suspicion for an ectopic pregnancy but concern for completed spontaneous abortion.  Radiology reports there is no intrauterine gestational sac but large volume complex material likely within  the proximal vaginal canal suggestive of blood product in the spending of spontaneous abortion.  Patient will need serial hCGs.  On repeat evaluation vital signs are improved but she is still having a lot of pain.  She was given Toradol as morphine has not been helpful.    {Document critical care time when appropriate:1} {Document review of labs and clinical decision tools ie heart score, Chads2Vasc2 etc:1}  {Document your independent review of radiology images, and any outside records:1} {Document your discussion with family members, caretakers, and with consultants:1} {Document social determinants of health affecting pt's care:1} {Document your decision making why or why not admission, treatments were needed:1} Final Clinical Impression(s) / ED Diagnoses Final diagnoses:  None    Rx / DC Orders ED Discharge Orders     None

## 2023-04-26 MED ORDER — HYDROMORPHONE HCL 1 MG/ML IJ SOLN
1.0000 mg | Freq: Once | INTRAMUSCULAR | Status: AC
  Administered 2023-04-26: 1 mg via INTRAVENOUS
  Filled 2023-04-26: qty 1

## 2023-04-26 MED ORDER — OXYCODONE HCL 5 MG PO TABS: 5.0000 mg | ORAL_TABLET | ORAL | 0 refills | Status: DC | PRN

## 2023-04-26 MED ORDER — LACTATED RINGERS IV BOLUS
1000.0000 mL | Freq: Once | INTRAVENOUS | Status: AC
  Administered 2023-04-26: 1000 mL via INTRAVENOUS

## 2023-04-26 MED ORDER — NAPROXEN 500 MG PO TABS: 500.0000 mg | ORAL_TABLET | Freq: Two times a day (BID) | ORAL | 0 refills | Status: DC

## 2023-04-26 MED ORDER — ONDANSETRON 4 MG PO TBDP: 4.0000 mg | ORAL_TABLET | Freq: Three times a day (TID) | ORAL | 0 refills | Status: DC | PRN

## 2023-04-26 NOTE — ED Notes (Signed)
Pt states she has no one that could pick her up from hospital. 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 Pt ambulated with steady gait and a/ox4 at discharge.

## 2023-04-26 NOTE — ED Provider Notes (Signed)
  Provider Note MRN:  [id number]  Arrival date & time: 04/26/23    ED Course and Medical Decision Making  Assumed care from Dr. Anitra Lauth at shift change.  Active miscarriage, plan is for reevaluation after further pain medication.  Anticipating discharge.  2 AM update: Patient is feeling better, blood pressure improved, heart rate in the 80s.  Appropriate for discharge with symptomatic management, close follow-up, return precautions.  Procedures  Final Clinical Impressions(s) / ED Diagnoses     ICD-10-CM   1. Miscarriage  O03.9       ED Discharge Orders          Ordered    ondansetron (ZOFRAN-ODT) 4 MG disintegrating tablet  Every 8 hours PRN        04/26/23 0211    oxyCODONE (ROXICODONE) 5 MG immediate release tablet  Every 4 hours PRN        04/26/23 0211    naproxen (NAPROSYN) 500 MG tablet  2 times daily        04/26/23 0211              Discharge Instructions      Your ultrasound showed that you have miscarried.  You will have abdominal cramps and bleeding for the next few days but the heavy bleeding should be over.  You can take over the counter medication for pain.    Elmer Sow. Pilar Plate, MD [facility] Emergency Medicine [facility] [email or web address]    Sabas Sous, MD 04/26/23 [id number]

## 2023-04-26 NOTE — Discharge Instructions (Signed)
Your ultrasound showed that you have miscarried.  You will have abdominal cramps and bleeding for the next few days but the heavy bleeding should be over.  You can take over the counter medication for pain.

## 2023-07-02 ENCOUNTER — Emergency Department (HOSPITAL_COMMUNITY)
Admission: EM | Admit: 2023-07-02 | Discharge: 2023-07-02 | Disposition: A | Discharge status: Home / Self Care | Payer: Self-pay | Attending: Emergency Medicine | Admitting: Emergency Medicine

## 2023-07-02 ENCOUNTER — Encounter (HOSPITAL_COMMUNITY): Payer: Self-pay

## 2023-07-02 DIAGNOSIS — S90569A Insect bite (nonvenomous), unspecified ankle, initial encounter: Secondary | ICD-10-CM

## 2023-07-02 DIAGNOSIS — S90561A Insect bite (nonvenomous), right ankle, initial encounter: Secondary | ICD-10-CM | POA: Insufficient documentation

## 2023-07-02 DIAGNOSIS — S90562A Insect bite (nonvenomous), left ankle, initial encounter: Secondary | ICD-10-CM | POA: Insufficient documentation

## 2023-07-02 DIAGNOSIS — R509 Fever, unspecified: Secondary | ICD-10-CM | POA: Insufficient documentation

## 2023-07-02 DIAGNOSIS — W57XXXA Bitten or stung by nonvenomous insect and other nonvenomous arthropods, initial encounter: Secondary | ICD-10-CM | POA: Insufficient documentation

## 2023-07-02 MED ORDER — HYDROCORTISONE 1 % EX CREA: TOPICAL_CREAM | CUTANEOUS | 0 refills | Status: DC

## 2023-07-02 MED ORDER — MUPIROCIN CALCIUM 2 % EX CREA: 1.0000 | TOPICAL_CREAM | Freq: Two times a day (BID) | CUTANEOUS | 0 refills | Status: DC

## 2023-07-02 NOTE — Discharge Instructions (Signed)
You were seen in the emergency department for your bug bites.  This appeared consistent with the likely chigger bites or possible mosquito bites.  You can continue to take Benadryl as needed for itching and I have given you a steroid cream and antibiotic cream that you can use as well.  You can also try cool compresses.  You should make sure that you wash all your clothing and sheets and towels to prevent reexposures after you had the exterminator come.  You can follow-up with your primary doctor to have your wounds rechecked.  You should return to the emergency department if you are having spreading redness from the wounds, continued fevers or any other new or concerning symptoms.

## 2023-07-02 NOTE — ED Triage Notes (Signed)
Pt states that she has several insect bites around her ankles that are itchy and swollen.

## 2023-07-02 NOTE — ED Provider Notes (Signed)
[place] EMERGENCY DEPARTMENT AT [facility] Provider Note   CSN: [id number] Arrival date & time: 07/02/23  2010     History  Chief Complaint  Patient presents with   Insect Bite    Alexandria [name] is a 22 y.o. female.  Patient is a 22 year old female with no significant past medical history presenting to the emergency department with insect bites to her ankles.  She states that she has noticed these bites appear over the last 3 days.  She states that they are very itchy and has noticed a new bite going up her leg each day.  She states that she had felt feverish today.  She states that she noticed some pus draining from one of the lesions on her right ankle earlier today.  She states that she did recently have an exterminator come for her house and there was no signs of any bedbugs but did report that there were chiggers in her house.  The history is provided by the patient.       Home Medications Prior to Admission medications   Medication Sig Start Date End Date Taking? Authorizing Provider  hydrocortisone cream 1 % Apply to affected area 2 times daily 07/02/23  Yes Theresia Lo, [place] K, DO  mupirocin cream (BACTROBAN) 2 % Apply 1 Application topically 2 (two) times daily. 07/02/23  Yes Elayne Snare K, DO  acetaminophen (TYLENOL) 500 MG tablet Take 500 mg by mouth every 6 (six) hours as needed for moderate pain.    [provider]  hydrocortisone (ANUSOL-HC) 2.5 % rectal cream Apply rectally 2 times daily Patient not taking: No sig reported 05/18/18   Eustace Moore, MD  ibuprofen (ADVIL) 800 MG tablet Take 1 tablet (800 mg total) by mouth 3 (three) times daily. Patient not taking: No sig reported 01/14/20   Arby Barrette, MD  naproxen (NAPROSYN) 500 MG tablet Take 1 tablet (500 mg total) by mouth 2 (two) times daily. 04/26/23   Sabas Sous, MD  ondansetron (ZOFRAN-ODT) 4 MG disintegrating tablet Take 1 tablet (4 mg total) by mouth every  8 (eight) hours as needed for nausea or vomiting. 04/26/23   Sabas Sous, MD  oxyCODONE (ROXICODONE) 5 MG immediate release tablet Take 1 tablet (5 mg total) by mouth every 4 (four) hours as needed for severe pain. 04/26/23   Sabas Sous, MD      Allergies    Other    Review of Systems   Review of Systems  Physical Exam Updated Vital Signs BP 124/68 (BP Location: Left Arm)   Pulse 91   Temp 98.4 F (36.9 C) (Oral)   Resp 17   LMP 09/05/2022   SpO2 100%  Physical Exam Vitals and nursing note reviewed.  Constitutional:      General: She is not in acute distress.    Appearance: Normal appearance.  HENT:     Head: Normocephalic and atraumatic.     Nose: Nose normal.     Mouth/Throat:     Mouth: Mucous membranes are moist.  Eyes:     Extraocular Movements: Extraocular movements intact.  Pulmonary:     Effort: Pulmonary effort is normal.  Musculoskeletal:        General: Normal range of motion.     Cervical back: Normal range of motion.  Skin:    General: Skin is warm and dry.     Comments: Erythematous papular appearing lesions to bilateral ankles consistent with insect bites, no  surrounding spreading erythema, warmth or drainage  Neurological:     General: No focal deficit present.     Mental Status: She is alert and oriented to person, place, and time.  Psychiatric:        Mood and Affect: Mood normal.        Behavior: Behavior normal.     ED Results / Procedures / Treatments   Labs (all labs ordered are listed, but only abnormal results are displayed) Labs Reviewed - No data to display  EKG None  Radiology No results found.  Procedures Procedures    Medications Ordered in ED Medications - No data to display  ED Course/ Medical Decision Making/ A&P                             Medical Decision Making This patient presents to the ED with chief complaint(s) of bug bites with no pertinent past medical history which further complicates the  presenting complaint. The complaint involves an extensive differential diagnosis and also carries with it a high risk of complications and morbidity.    The differential diagnosis includes appears consistent with bug bite/chigger bite, no evidence of acute infection  Additional history obtained: Additional history obtained from N/A Records reviewed N/A  ED Course and Reassessment: On patient's arrival she is hemodynamically stable in no acute distress.  She does have wounds consistent with bug bites and no signs of superimposed cellulitis.  Patient will be given Benadryl and steroid cream for symptomatic management and was recommended close primary care follow-up.  Independent labs interpretation:  N/A  Independent visualization of imaging: - N/A  Consultation: - Consulted or discussed management/test interpretation w/ external professional: N/A  Consideration for admission or further workup: Patient has no emergent conditions requiring admission or further work-up at this time and is stable for discharge home with primary care follow-up  Social Determinants of health: N/A            Final Clinical Impression(s) / ED Diagnoses Final diagnoses:  Insect bite of ankle, unspecified laterality, initial encounter    Rx / DC Orders ED Discharge Orders          Ordered    hydrocortisone cream 1 %        07/02/23 2249    mupirocin cream (BACTROBAN) 2 %  2 times daily        07/02/23 2249              Elayne Snare K, DO 07/02/23 2249

## 2023-10-18 ENCOUNTER — Emergency Department (HOSPITAL_COMMUNITY)
Admission: EM | Admit: 2023-10-18 | Discharge: 2023-10-18 | Disposition: A | Discharge status: Home / Self Care | Payer: Self-pay | Attending: Emergency Medicine | Admitting: Emergency Medicine

## 2023-10-18 ENCOUNTER — Emergency Department (HOSPITAL_COMMUNITY): Payer: Self-pay

## 2023-10-18 ENCOUNTER — Encounter (HOSPITAL_COMMUNITY): Payer: Self-pay

## 2023-10-18 ENCOUNTER — Other Ambulatory Visit: Payer: Self-pay

## 2023-10-18 DIAGNOSIS — G43109 Migraine with aura, not intractable, without status migrainosus: Secondary | ICD-10-CM | POA: Insufficient documentation

## 2023-10-18 DIAGNOSIS — H538 Other visual disturbances: Secondary | ICD-10-CM

## 2023-10-18 LAB — BASIC METABOLIC PANEL
Anion gap: 9 (ref 5–15)
BUN: 13 mg/dL (ref 6–20)
CO2: 23 mmol/L (ref 22–32)
Calcium: 8.8 mg/dL — ABNORMAL LOW (ref 8.9–10.3)
Chloride: 104 mmol/L (ref 98–111)
Creatinine, Ser: 0.46 mg/dL (ref 0.44–1.00)
GFR, Estimated: >60 mL/min (ref >60)
Glucose, Bld: 94 mg/dL (ref 70–99)
Potassium: 3.5 mmol/L (ref 3.5–5.1)
Sodium: 136 mmol/L (ref 135–145)

## 2023-10-18 LAB — CBC
HCT: 34 % — ABNORMAL LOW (ref 36.0–46.0)
Hemoglobin: 11.7 g/dL — ABNORMAL LOW (ref 12.0–15.0)
MCH: 27.3 pg (ref 26.0–34.0)
MCHC: 34.4 g/dL (ref 30.0–36.0)
MCV: 79.3 fL — ABNORMAL LOW (ref 80.0–100.0)
Platelets: 324 10*3/uL (ref 150–400)
RBC: 4.29 MIL/uL (ref 3.87–5.11)
RDW: 18.5 % — ABNORMAL HIGH (ref 11.5–15.5)
WBC: 9.3 10*3/uL (ref 4.0–10.5)
nRBC: 0 % (ref 0.0–0.2)

## 2023-10-18 LAB — HCG, SERUM, QUALITATIVE: Preg, Serum: NEGATIVE

## 2023-10-18 LAB — CBG MONITORING, ED: Glucose-Capillary: 91 mg/dL (ref 70–99)

## 2023-10-18 MED ORDER — DIPHENHYDRAMINE HCL 50 MG/ML IJ SOLN
25.0000 mg | Freq: Once | INTRAMUSCULAR | Status: AC
  Administered 2023-10-18: 25 mg via INTRAVENOUS
  Filled 2023-10-18: qty 1

## 2023-10-18 MED ORDER — KETOROLAC TROMETHAMINE 15 MG/ML IJ SOLN
15.0000 mg | Freq: Once | INTRAMUSCULAR | Status: AC
  Administered 2023-10-18: 15 mg via INTRAVENOUS
  Filled 2023-10-18: qty 1

## 2023-10-18 MED ORDER — IOHEXOL 350 MG/ML SOLN
75.0000 mL | Freq: Once | INTRAVENOUS | Status: AC | PRN
  Administered 2023-10-18: 75 mL via INTRAVENOUS

## 2023-10-18 MED ORDER — METOCLOPRAMIDE HCL 5 MG/ML IJ SOLN
10.0000 mg | Freq: Once | INTRAMUSCULAR | Status: AC
  Administered 2023-10-18: 10 mg via INTRAVENOUS
  Filled 2023-10-18: qty 2

## 2023-10-18 MED ORDER — METOCLOPRAMIDE HCL 10 MG PO TABS: 10.0000 mg | ORAL_TABLET | Freq: Four times a day (QID) | ORAL | 0 refills | Status: DC

## 2023-10-18 NOTE — Discharge Instructions (Addendum)
You were seen for your headache in the emergency department.  You were given medicines which improved your symptoms.  At home, please take Tylenol and ibuprofen for your headache.  You may also take the Reglan we have prescribed you for your headache or any nausea or vomiting that you have.  You may take this with Benadryl for severe headaches but please note that this will make you drowsy.    Check your MyChart online for the results of any tests that had not resulted by the time you left the emergency department.   Follow-up with your primary doctor and ophthalmology in 2-3 days regarding your visit if your symptoms do not improve.  Follow-up with the brain doctors (neurology) about your headache as well.  Return immediately to the emergency department if you experience any of the following: Vision changes, numbness or weakness of your arms or legs, or any other concerning symptoms.    Thank you for visiting our Emergency Department. It was a pleasure taking care of you today.

## 2023-10-18 NOTE — ED Notes (Signed)
Patient transported to CT 

## 2023-10-18 NOTE — ED Provider Notes (Signed)
[place] EMERGENCY DEPARTMENT AT [facility] Provider Note   CSN: [id number] Arrival date & time: 10/18/23  0350     History {Add pertinent medical, surgical, social history, OB history to HPI:1} Chief Complaint  Patient presents with   Blurred Vision    Alexandria [name] is a 22 y.o. female.  22 year old female previously healthy presents emergency room with headache and vision changes.  Patient reports that at 2:30 PM she started experiencing blurry vision out of both eyes. Shortly afterwards started experiencing a sudden onset L sided headache that she says felt like a "hangover" headache.  Says that it is now holosystolic.  8/10 in severity.  No fever, vomiting, numbness or weakness of her arms or legs, or difficulty speaking.  Called 911 because of the vision changes.  Also says that she has been very stressed recently and sleeping less than usual.       Home Medications Prior to Admission medications   Medication Sig Start Date End Date Taking? Authorizing Provider  acetaminophen (TYLENOL) 500 MG tablet Take 500 mg by mouth every 6 (six) hours as needed for moderate pain.    [provider]  hydrocortisone (ANUSOL-HC) 2.5 % rectal cream Apply rectally 2 times daily Patient not taking: No sig reported 05/18/18   Eustace Moore, MD  hydrocortisone cream 1 % Apply to affected area 2 times daily 07/02/23   Elayne Snare K, DO  ibuprofen (ADVIL) 800 MG tablet Take 1 tablet (800 mg total) by mouth 3 (three) times daily. Patient not taking: No sig reported 01/14/20   Arby Barrette, MD  mupirocin cream (BACTROBAN) 2 % Apply 1 Application topically 2 (two) times daily. 07/02/23   Elayne Snare K, DO  naproxen (NAPROSYN) 500 MG tablet Take 1 tablet (500 mg total) by mouth 2 (two) times daily. 04/26/23   Sabas Sous, MD  ondansetron (ZOFRAN-ODT) 4 MG disintegrating tablet Take 1 tablet (4 mg total) by mouth every 8 (eight) hours as needed for  nausea or vomiting. 04/26/23   Sabas Sous, MD  oxyCODONE (ROXICODONE) 5 MG immediate release tablet Take 1 tablet (5 mg total) by mouth every 4 (four) hours as needed for severe pain. 04/26/23   Sabas Sous, MD      Allergies    Other    Review of Systems   Review of Systems  Physical Exam Updated Vital Signs BP 107/76 (BP Location: Left Arm)   Pulse 75   Temp 98.1 F (36.7 C) (Oral)   Resp 16   Ht 4\' 11"  (1.499 m)   Wt 54.4 kg   SpO2 100%   Breastfeeding Unknown   BMI 24.22 kg/m  Physical Exam Vitals and nursing note reviewed.  Constitutional:      General: She is not in acute distress.    Appearance: She is well-developed.  HENT:     Head: Normocephalic and atraumatic.     Right Ear: External ear normal.     Left Ear: External ear normal.     Nose: Nose normal.  Eyes:     Extraocular Movements: Extraocular movements intact.     Conjunctiva/sclera: Conjunctivae normal.     Pupils: Pupils are equal, round, and reactive to light.     Comments: Pupils 5 mm bilaterally.  Visual acuity 20/25 in both eyes  Pulmonary:     Effort: Pulmonary effort is normal. No respiratory distress.  Musculoskeletal:     Cervical back: Normal range of motion and  neck supple.     Right lower leg: No edema.     Left lower leg: No edema.  Skin:    General: Skin is warm and dry.  Neurological:     Mental Status: She is alert and oriented to person, place, and time. Mental status is at baseline.     Comments: MENTAL STATUS: AAOx3 CRANIAL NERVES: II: Pupils equal and reactive 5 mm BL, no RAPD, no VF deficits III, IV, VI: EOM intact, no gaze preference or deviation, no nystagmus. V: normal sensation to light touch in V1, V2, and V3 segments bilaterally VII: no facial weakness or asymmetry, no nasolabial fold flattening VIII: normal hearing to speech and finger friction IX, X: normal palatal elevation, no uvular deviation XI: 5/5 head turn and 5/5 shoulder shrug bilaterally XII:  midline tongue protrusion MOTOR: 5/5 strength in R shoulder flexion, elbow flexion and extension, and grip strength. 5/5 strength in L shoulder flexion, elbow flexion and extension, and grip strength.  5/5 strength in R hip and knee flexion, knee extension, ankle plantar and dorsiflexion. 5/5 strength in L hip and knee flexion, knee extension, ankle plantar and dorsiflexion. SENSORY: Normal sensation to light touch in all extremities COORD: Normal finger to nose and heel to shin, no tremor, no dysmetria  Psychiatric:        Mood and Affect: Mood normal.     ED Results / Procedures / Treatments   Labs (all labs ordered are listed, but only abnormal results are displayed) Labs Reviewed  HCG, SERUM, QUALITATIVE  CBG MONITORING, ED    EKG None  Radiology No results found.  Procedures Procedures  {Document cardiac monitor, telemetry assessment procedure when appropriate:1}  Medications Ordered in ED Medications - No data to display  ED Course/ Medical Decision Making/ A&P   {   Click here for ABCD2, HEART and other calculatorsREFRESH Note before signing :1}                              Medical Decision Making Amount and/or Complexity of Data Reviewed Labs: ordered. Radiology: ordered.  Risk Prescription drug management.   ***  {Document critical care time when appropriate:1} {Document review of labs and clinical decision tools ie heart score, Chads2Vasc2 etc:1}  {Document your independent review of radiology images, and any outside records:1} {Document your discussion with family members, caretakers, and with consultants:1} {Document social determinants of health affecting pt's care:1} {Document your decision making why or why not admission, treatments were needed:1} Final Clinical Impression(s) / ED Diagnoses Final diagnoses:  None    Rx / DC Orders ED Discharge Orders     None

## 2023-10-18 NOTE — ED Triage Notes (Signed)
Pt BIB EMS with reports of blurred vision. Pt works at [employer] and started having blurred vision while walking back to the floor. Pt now has a headache.

## 2024-05-26 ENCOUNTER — Other Ambulatory Visit: Payer: Self-pay

## 2024-05-26 ENCOUNTER — Encounter (HOSPITAL_COMMUNITY): Payer: Self-pay | Admitting: Emergency Medicine

## 2024-05-26 ENCOUNTER — Emergency Department (HOSPITAL_COMMUNITY)
Admission: EM | Admit: 2024-05-26 | Discharge: 2024-05-27 | Discharge status: Against Medical Advice | Payer: Self-pay | Attending: Emergency Medicine | Admitting: Emergency Medicine

## 2024-05-26 DIAGNOSIS — M545 Low back pain, unspecified: Secondary | ICD-10-CM | POA: Insufficient documentation

## 2024-05-26 DIAGNOSIS — R350 Frequency of micturition: Secondary | ICD-10-CM | POA: Insufficient documentation

## 2024-05-26 DIAGNOSIS — Z5321 Procedure and treatment not carried out due to patient leaving prior to being seen by health care provider: Secondary | ICD-10-CM | POA: Insufficient documentation

## 2024-05-26 DIAGNOSIS — R102 Pelvic and perineal pain: Secondary | ICD-10-CM | POA: Insufficient documentation

## 2024-05-26 LAB — URINALYSIS, ROUTINE W REFLEX MICROSCOPIC
Bilirubin Urine: NEGATIVE
Glucose, UA: NEGATIVE mg/dL
Ketones, ur: NEGATIVE mg/dL
Nitrite: NEGATIVE
Protein, ur: 100 mg/dL — AB
Specific Gravity, Urine: 1.004 — ABNORMAL LOW (ref 1.005–1.030)
WBC, UA: >50 WBC/hpf (ref 0–5)
pH: 6 (ref 5.0–8.0)

## 2024-05-26 LAB — PREGNANCY, URINE: Preg Test, Ur: NEGATIVE

## 2024-05-26 MED ORDER — ACETAMINOPHEN 500 MG PO TABS
1000.0000 mg | ORAL_TABLET | Freq: Once | ORAL | Status: AC
  Administered 2024-05-26: 1000 mg via ORAL
  Filled 2024-05-26: qty 2

## 2024-05-26 NOTE — ED Triage Notes (Signed)
 Patient presents due to low pack pain for a few hours. She also feels contractions as if she is giving birth. Pelvic pain started about 2-3 days ago and has worsened to this point. She reports urinary frequency, but also says she has been drinking a lot of water due to concern for a possible UTI.

## 2024-05-27 ENCOUNTER — Telehealth (HOSPITAL_COMMUNITY): Payer: Self-pay

## 2024-05-27 ENCOUNTER — Emergency Department (HOSPITAL_COMMUNITY)
Admission: EM | Admit: 2024-05-27 | Discharge: 2024-05-28 | Disposition: A | Discharge status: Home / Self Care | Payer: Self-pay | Attending: Emergency Medicine | Admitting: Emergency Medicine

## 2024-05-27 ENCOUNTER — Encounter (HOSPITAL_COMMUNITY): Payer: Self-pay

## 2024-05-27 ENCOUNTER — Ambulatory Visit (HOSPITAL_COMMUNITY)
Admission: EM | Admit: 2024-05-27 | Discharge: 2024-05-27 | Disposition: A | Discharge status: Home / Self Care | Payer: Self-pay | Attending: Family Medicine | Admitting: Family Medicine

## 2024-05-27 DIAGNOSIS — N3000 Acute cystitis without hematuria: Secondary | ICD-10-CM

## 2024-05-27 DIAGNOSIS — D72829 Elevated white blood cell count, unspecified: Secondary | ICD-10-CM | POA: Insufficient documentation

## 2024-05-27 LAB — BASIC METABOLIC PANEL WITH GFR
Anion gap: 10 (ref 5–15)
BUN: 11 mg/dL (ref 6–20)
CO2: 26 mmol/L (ref 22–32)
Calcium: 9.1 mg/dL (ref 8.9–10.3)
Chloride: 103 mmol/L (ref 98–111)
Creatinine, Ser: 0.7 mg/dL (ref 0.44–1.00)
GFR, Estimated: >60 mL/min (ref >60)
Glucose, Bld: 125 mg/dL — ABNORMAL HIGH (ref 70–99)
Potassium: 3.7 mmol/L (ref 3.5–5.1)
Sodium: 139 mmol/L (ref 135–145)

## 2024-05-27 LAB — CBC
HCT: 38.5 % (ref 36.0–46.0)
Hemoglobin: 13.4 g/dL (ref 12.0–15.0)
MCH: 30.5 pg (ref 26.0–34.0)
MCHC: 34.8 g/dL (ref 30.0–36.0)
MCV: 87.7 fL (ref 80.0–100.0)
Platelets: 296 10*3/uL (ref 150–400)
RBC: 4.39 MIL/uL (ref 3.87–5.11)
RDW: 13.2 % (ref 11.5–15.5)
WBC: 16.6 10*3/uL — ABNORMAL HIGH (ref 4.0–10.5)
nRBC: 0 % (ref 0.0–0.2)

## 2024-05-27 LAB — POCT URINALYSIS DIP (MANUAL ENTRY)
Bilirubin, UA: NEGATIVE
Glucose, UA: NEGATIVE mg/dL
Ketones, POC UA: NEGATIVE mg/dL
Nitrite, UA: POSITIVE — AB
Protein Ur, POC: NEGATIVE mg/dL
Spec Grav, UA: 1.02 (ref 1.010–1.025)
Urobilinogen, UA: 0.2 U/dL
pH, UA: 5.5 (ref 5.0–8.0)

## 2024-05-27 LAB — HCG, SERUM, QUALITATIVE: Preg, Serum: NEGATIVE

## 2024-05-27 LAB — POCT URINE PREGNANCY: Preg Test, Ur: NEGATIVE

## 2024-05-27 LAB — HIV ANTIBODY (ROUTINE TESTING W REFLEX): HIV Screen 4th Generation wRfx: NONREACTIVE

## 2024-05-27 MED ORDER — HYDROCODONE-ACETAMINOPHEN 5-325 MG PO TABS
2.0000 | ORAL_TABLET | Freq: Once | ORAL | Status: AC
  Administered 2024-05-27: 2 via ORAL
  Filled 2024-05-27: qty 2

## 2024-05-27 MED ORDER — NITROFURANTOIN MONOHYD MACRO 100 MG PO CAPS: 100.0000 mg | ORAL_CAPSULE | Freq: Two times a day (BID) | ORAL | 0 refills | Status: AC

## 2024-05-27 NOTE — ED Provider Triage Note (Signed)
 Emergency Medicine Provider Triage Evaluation Note  Alexandria [name] , a 23 y.o. female  was evaluated in triage.  Pt complains of bilateral lower back pain along with pelvic pain.  Previously in urgent care for the same, extensive workup done demonstrated likely UTI.  She presents here to the ED for further management of her condition.  Urine pregnancy done at urgent care today screen negative, UA done at previous visit yesterday showed increased leukocyte esterase, proteinuria, and bacteriuria.  Review of Systems  Positive: Back pain, pelvic pain, fatigue Negative:   Physical Exam  BP 104/80 (BP Location: Right Arm)   Pulse 87   Temp 98.5 F (36.9 C)   Resp 16   LMP 05/11/2024 (Exact Date)   SpO2 100%  Gen:   Awake, no distress   Resp:  Normal effort  MSK:   Moves extremities without difficulty  Other:  Abdomen is tender to palpation of the left lower abdomen and there is positive CVA tenderness bilaterally.  Medical Decision Making  Medically screening exam initiated at 6:37 PM.  Appropriate orders placed.  Alexandria [name] was informed that the remainder of the evaluation will be completed by another provider, this initial triage assessment does not replace that evaluation, and the importance of remaining in the ED until their evaluation is complete.  Given her assessment and previous evidence of urinary tract infection, suspect this patient either has complicated cystitis or possible pyelonephritis.  Differentials potential nephro or urolithiasis.  Began initial workup for cystitis, sent off urine for culture, pending further results.   Alexandria [name], [place] 05/27/24 [id number]

## 2024-05-27 NOTE — ED Triage Notes (Signed)
 Patient presenting with vaginal discomfort, abdominal cramping, low back pain, urinary frequency, cloudy urine with reduced output onset 3 days ago. Denies vaginal itching or discharge.  Prescriptions or OTC medications tried: Yes- Ibuprofen     with no relief.   Patient states metronidazole does not work for her when she take it. Patient has history of chronic BV and this medication no longer works for her.

## 2024-05-27 NOTE — ED Triage Notes (Signed)
 Pt from home with reports of left sided flank pain and lower abdominal pain. Pt denies fevers or vomiting at home.

## 2024-05-27 NOTE — ED Provider Notes (Signed)
 [facility]    CSN: [id number] Arrival date & time: 05/27/24  1156      History   Chief Complaint Chief Complaint  Patient presents with   Dysuria    HPI Alexandria [name] is a 23 y.o. female.   The patient reports 3 days of urinary discomfort, lower back pain, cloudy urine and decreased output. She has not had any flank pain, fevers, chills, nausea, vomiting, chest pain, shortness of breath, lower abdominal or pelvic pain. She does have a complicated hx of prolonged, partially treated BV last year that did not respond to flagyl but was treated with another medication that she cannot remember.   The history is provided by the patient.  Dysuria Associated symptoms: abdominal pain (cramping)   Associated symptoms: no fever, no flank pain, no nausea, no vaginal discharge and no vomiting     Past Medical History:  Diagnosis Date   Allergy    Urinary tract infection     Patient Active Problem List   Diagnosis Date Noted   Cesarean delivery delivered 04/01/2018   Engagement of fetus in breech position 03/30/2018   Rupture of membranes with delay of delivery 03/30/2018   Breech birth 03/30/2018    Past Surgical History:  Procedure Laterality Date   CESAREAN SECTION N/A 03/30/2018   Procedure: CESAREAN SECTION;  Surgeon: Jan Mcgill, MD;  Location: [facility] BIRTHING SUITES;  Service: Obstetrics;  Laterality: N/A;    OB History     Gravida  2   Para  1   Term  1   Preterm      AB      Living  1      SAB      IAB      Ectopic      Multiple  0   Live Births  1            Home Medications    Prior to Admission medications   Medication Sig Start Date End Date Taking? Authorizing Provider  ibuprofen  (ADVIL ) 800 MG tablet Take 1 tablet (800 mg total) by mouth 3 (three) times daily. 01/14/20  Yes Wynetta Heckle, MD  nitrofurantoin, macrocrystal-monohydrate, (MACROBID) 100 MG capsule Take 1 capsule (100 mg total) by mouth 2 (two) times  daily for 7 days. 05/27/24 06/03/24 Yes Claybon Cuna, MD    Family History Family History  Problem Relation Age of Onset   Healthy Mother    Healthy Father     Social History Social History   Tobacco Use   Smoking status: Never   Smokeless tobacco: Never  Vaping Use   Vaping status: Never Used  Substance Use Topics   Alcohol use: No   Drug use: No     Allergies   Pineapple and Other   Review of Systems Review of Systems  Constitutional:  Negative for appetite change, chills, diaphoresis, fatigue and fever.  Respiratory:  Negative for shortness of breath.   Cardiovascular:  Negative for chest pain and palpitations.  Gastrointestinal:  Positive for abdominal pain (cramping). Negative for blood in stool, constipation, diarrhea, nausea and vomiting.  Genitourinary:  Positive for decreased urine volume, dysuria, frequency and urgency. Negative for difficulty urinating, dyspareunia, flank pain, hematuria, menstrual problem, pelvic pain, vaginal bleeding, vaginal discharge and vaginal pain.  Musculoskeletal:  Positive for back pain (lower lumbar). Negative for joint swelling, myalgias, neck pain and neck stiffness.  Skin:  Negative for color change and rash.  Neurological:  Negative  for dizziness, weakness, light-headedness and numbness.     Physical Exam Triage Vital Signs ED Triage Vitals  Encounter Vitals Group     BP 05/27/24 1239 100/67     Girls Systolic BP Percentile --      Girls Diastolic BP Percentile --      Boys Systolic BP Percentile --      Boys Diastolic BP Percentile --      Pulse Rate 05/27/24 1239 74     Resp 05/27/24 1239 16     Temp 05/27/24 1239 98.3 F (36.8 C)     Temp Source 05/27/24 1239 Oral     SpO2 05/27/24 1239 97 %     Weight 05/27/24 1239 125 lb (56.7 kg)     Height 05/27/24 1239 4' 11 (1.499 m)     Head Circumference --      Peak Flow --      Pain Score 05/27/24 1236 8     Pain Loc --      Pain Education --      Exclude  from Growth Chart --    No data found.  Updated Vital Signs BP 100/67 (BP Location: Right Arm)   Pulse 74   Temp 98.3 F (36.8 C) (Oral)   Resp 16   Ht 4' 11 (1.499 m)   Wt 56.7 kg   LMP 05/11/2024 (Exact Date)   SpO2 97%   Breastfeeding No   BMI 25.25 kg/m   Visual Acuity Right Eye Distance:   Left Eye Distance:   Bilateral Distance:    Right Eye Near:   Left Eye Near:    Bilateral Near:     Physical Exam Vitals reviewed.  Constitutional:      General: She is not in acute distress.    Appearance: Normal appearance. She is normal weight. She is not ill-appearing, toxic-appearing or diaphoretic.  HENT:     Mouth/Throat:     Mouth: Mucous membranes are moist.     Pharynx: Oropharynx is clear.   Eyes:     General: No scleral icterus.       Right eye: No discharge.        Left eye: No discharge.     Extraocular Movements: Extraocular movements intact.     Pupils: Pupils are equal, round, and reactive to light.    Cardiovascular:     Pulses: Normal pulses.  Pulmonary:     Effort: Pulmonary effort is normal.  Abdominal:     General: Abdomen is flat. There is no distension.     Palpations: Abdomen is soft.     Tenderness: There is no abdominal tenderness. There is no right CVA tenderness, left CVA tenderness, guarding or rebound.   Musculoskeletal:     Right lower leg: No edema.     Left lower leg: No edema.     Comments: Mild TTP over the left paraspinal muscles at the L4-S1 level   Skin:    General: Skin is warm.     Capillary Refill: Capillary refill takes 2 to 3 seconds.     Coloration: Skin is not jaundiced or pale.     Findings: No rash.   Neurological:     General: No focal deficit present.     Mental Status: She is alert.     Gait: Gait normal.   Psychiatric:        Mood and Affect: Mood normal.        Thought Content: Thought content normal.  Judgment: Judgment normal.      UC Treatments / Results  Labs (all labs ordered are  listed, but only abnormal results are displayed) Labs Reviewed  POCT URINALYSIS DIP (MANUAL ENTRY) - Abnormal; Notable for the following components:      Result Value   Clarity, UA cloudy (*)    Blood, UA moderate (*)    Nitrite, UA Positive (*)    Leukocytes, UA Large (3+) (*)    All other components within normal limits  HIV ANTIBODY (ROUTINE TESTING W REFLEX)  RPR  POCT URINE PREGNANCY  CERVICOVAGINAL ANCILLARY ONLY    EKG   Radiology No results found.  Procedures Procedures (including critical care time)  Medications Ordered in UC Medications - No data to display  Initial Impression / Assessment and Plan / UC Course  I have reviewed the triage vital signs and the nursing notes.  Pertinent labs & imaging results that were available during my care of the patient were reviewed by me and considered in my medical decision making (see chart for details).    Acute uncomplicated cystitis -The patient's UA is positive for nitrites and leuk esterase.  She currently has no systemic symptoms and no CVA tenderness. - Will start her on 7 days of Macrobid given her past history. - I also spoke with her about proper hygiene, use of probiotics, and proper administration of the antibiotics as well as monitoring for further signs of vaginal infection.  We also discussed safe sex practices. - Vaginal swab and HIV/RPR pending.  Will follow-up with her on the results of this and appropriate therapies. - I advise she follow-up with her PCP   Final Clinical Impressions(s) / UC Diagnoses   Final diagnoses:  Acute cystitis without hematuria     Discharge Instructions      You have a urinary tract infection. We discussed the risk factors for this including dehydration, new sexual partners, changes in pH balance, recent antibiotic use. Take the antibiotics with food until they are complete even if you are not feeling symptoms. I recommend starting a probiotic as well. If you develop  flank pain, fever, chills, nausea, vomiting go to the ER. If your symptoms do not resolve within a week return to urgent care or your PCP. We will call you with the results of your other lab work.     ED Prescriptions     Medication Sig Dispense Auth. Provider   nitrofurantoin, macrocrystal-monohydrate, (MACROBID) 100 MG capsule Take 1 capsule (100 mg total) by mouth 2 (two) times daily for 7 days. 14 capsule Claybon Cuna, MD      PDMP not reviewed this encounter.   Claybon Cuna, MD 05/27/24 2158

## 2024-05-27 NOTE — Discharge Instructions (Signed)
 You have a urinary tract infection. We discussed the risk factors for this including dehydration, new sexual partners, changes in pH balance, recent antibiotic use. Take the antibiotics with food until they are complete even if you are not feeling symptoms. I recommend starting a probiotic as well. If you develop flank pain, fever, chills, nausea, vomiting go to the ER. If your symptoms do not resolve within a week return to urgent care or your PCP. We will call you with the results of your other lab work.

## 2024-05-27 NOTE — ED Notes (Signed)
 Pt left AMA

## 2024-05-27 NOTE — Telephone Encounter (Signed)
 Informed by front desk staff: Hello. the above pt called stated after she left she became in severe pain pt describe pain as giving birth...pt contact number [phone].  According to provider instructions from office visit today: If you develop flank pain, fever, chills, nausea, vomiting go to the ER. If your symptoms do not resolve within a week return to urgent care or your PCP.  Called and informed the Patient of the above. Patient states she is having severe left flank pain to the point of being unable to sit. Patient verbalized understanding of her need to follow up ASAP in the emergency room.

## 2024-05-28 ENCOUNTER — Emergency Department (HOSPITAL_COMMUNITY): Payer: Self-pay

## 2024-05-28 LAB — CERVICOVAGINAL ANCILLARY ONLY
Bacterial Vaginitis (gardnerella): NEGATIVE
Candida Glabrata: NEGATIVE
Candida Vaginitis: NEGATIVE
Chlamydia: NEGATIVE
Comment: NEGATIVE
Comment: NEGATIVE
Comment: NEGATIVE
Comment: NEGATIVE
Comment: NEGATIVE
Comment: NORMAL
Neisseria Gonorrhea: NEGATIVE
Trichomonas: NEGATIVE

## 2024-05-28 LAB — GC/CHLAMYDIA PROBE AMP (~~LOC~~) NOT AT ARMC
Chlamydia: NEGATIVE
Comment: NEGATIVE
Comment: NORMAL
Neisseria Gonorrhea: NEGATIVE

## 2024-05-28 LAB — RPR: RPR Ser Ql: NONREACTIVE

## 2024-05-28 MED ORDER — SODIUM CHLORIDE 0.9 % IV SOLN
1.0000 g | Freq: Once | INTRAVENOUS | Status: AC
  Administered 2024-05-28: 1 g via INTRAVENOUS
  Filled 2024-05-28: qty 10

## 2024-05-28 MED ORDER — KETOROLAC TROMETHAMINE 15 MG/ML IJ SOLN
15.0000 mg | Freq: Once | INTRAMUSCULAR | Status: AC
  Administered 2024-05-28: 15 mg via INTRAVENOUS
  Filled 2024-05-28: qty 1

## 2024-05-28 MED ORDER — OXYCODONE HCL 5 MG PO TABS
5.0000 mg | ORAL_TABLET | Freq: Once | ORAL | Status: AC
  Administered 2024-05-28: 5 mg via ORAL
  Filled 2024-05-28: qty 1

## 2024-05-28 MED ORDER — IOHEXOL 350 MG/ML SOLN
75.0000 mL | Freq: Once | INTRAVENOUS | Status: AC | PRN
  Administered 2024-05-28: 75 mL via INTRAVENOUS

## 2024-05-28 MED ORDER — SULFAMETHOXAZOLE-TRIMETHOPRIM 800-160 MG PO TABS: 1.0000 | ORAL_TABLET | Freq: Two times a day (BID) | ORAL | 0 refills | Status: AC

## 2024-05-28 NOTE — ED Provider Notes (Signed)
 [place] [facility] AT [facility] Provider Note   CSN: [id number] Arrival date & time: 05/27/24  4098     Patient presents with: Flank Pain   Alexandria [name] is a 23 y.o. female who presents with complaints of abdominal and flank pain with associated dysuria and cloudy urine.  Symptoms have been ongoing for the past 4 days.  Was evaluated at urgent care yesterday.  UA was concerning for UTI.  She was discharged on Macrobid.  With worsening pain she presented here.    Flank Pain   Past Medical History:  Diagnosis Date   Allergy    Urinary tract infection         Prior to Admission medications   Medication Sig Start Date End Date Taking? Authorizing Provider  sulfamethoxazole -trimethoprim  (BACTRIM  DS) 800-160 MG tablet Take 1 tablet by mouth 2 (two) times daily for 10 days. 05/28/24 06/07/24 Yes Felicie Horning, PA-C  ibuprofen  (ADVIL ) 800 MG tablet Take 1 tablet (800 mg total) by mouth 3 (three) times daily. 01/14/20   Wynetta Heckle, MD  nitrofurantoin, macrocrystal-monohydrate, (MACROBID) 100 MG capsule Take 1 capsule (100 mg total) by mouth 2 (two) times daily for 7 days. 05/27/24 06/03/24  Claybon Cuna, MD    Allergies: Pineapple and Other    Review of Systems  Genitourinary:  Positive for flank pain.    Updated Vital Signs BP 101/66 (BP Location: Right Arm)   Pulse 99   Temp 98.1 F (36.7 C) (Oral)   Resp 16   LMP 05/11/2024 (Exact Date)   SpO2 94%   Physical Exam Vitals and nursing note reviewed.  Constitutional:      General: She is not in acute distress.    Appearance: She is well-developed.  HENT:     Head: Normocephalic and atraumatic.   Eyes:     Conjunctiva/sclera: Conjunctivae normal.    Cardiovascular:     Rate and Rhythm: Normal rate and regular rhythm.     Heart sounds: No murmur heard. Pulmonary:     Effort: Pulmonary effort is normal. No respiratory distress.     Breath sounds: Normal breath  sounds.  Abdominal:     Palpations: Abdomen is soft.     Tenderness: There is abdominal tenderness. There is left CVA tenderness.     Comments: LUQ tenderness, soft non distended   Musculoskeletal:        General: No swelling.     Cervical back: Neck supple.   Skin:    General: Skin is warm and dry.     Capillary Refill: Capillary refill takes less than 2 seconds.   Neurological:     Mental Status: She is alert.   Psychiatric:        Mood and Affect: Mood normal.     (all labs ordered are listed, but only abnormal results are displayed) Labs Reviewed  BASIC METABOLIC PANEL WITH GFR - Abnormal; Notable for the following components:      Result Value   Glucose, Bld 125 (*)    All other components within normal limits  CBC - Abnormal; Notable for the following components:   WBC 16.6 (*)    All other components within normal limits  URINE CULTURE  HCG, SERUM, QUALITATIVE  URINALYSIS, ROUTINE W REFLEX MICROSCOPIC    EKG: None  Radiology: CT ABDOMEN PELVIS W CONTRAST Result Date: 05/28/2024 CLINICAL DATA:  Acute abdominal pain. EXAM: CT ABDOMEN AND PELVIS WITH CONTRAST TECHNIQUE: Multidetector CT imaging of  the abdomen and pelvis was performed using the standard protocol following bolus administration of intravenous contrast. RADIATION DOSE REDUCTION: This exam was performed according to the departmental dose-optimization program which includes automated exposure control, adjustment of the mA and/or kV according to patient size and/or use of iterative reconstruction technique. CONTRAST:  75mL OMNIPAQUE  IOHEXOL  350 MG/ML SOLN COMPARISON:  09/10/2022 FINDINGS: Lower chest: No acute abnormality. Hepatobiliary: No focal liver abnormality is seen. No gallstones, gallbladder wall thickening, or biliary dilatation. Pancreas: Unremarkable. No pancreatic ductal dilatation or surrounding inflammatory changes. Spleen: Normal in size without focal abnormality. Adrenals/Urinary Tract: Normal  adrenal glands. No nephrolithiasis or hydronephrosis. Bilateral ureteral wall enhancement and thickening with mild left hydroureter. No ureteral calculi. Mild circumferential wall thickening of the urinary bladder. Stomach/Bowel: Stomach is within normal limits. Appendix appears normal. No evidence of bowel wall thickening, distention, or inflammatory changes. Vascular/Lymphatic: No significant vascular findings are present. No enlarged abdominal or pelvic lymph nodes. Reproductive: Uterus appears normal. Cyst in the left ovary measures 2.1 x 2.0 cm Other: No free fluid or fluid collections. No signs of pneumoperitoneum. Musculoskeletal: No acute or significant osseous findings. IMPRESSION: 1. Bilateral ureteral wall enhancement and thickening with mild left hydroureter. Mild circumferential wall thickening of the urinary bladder. Findings are compatible with urinary tract infection. 2. Left ovarian cyst measures 2.1 cm. No follow-up imaging recommended. Note: This recommendation does not apply to premenarchal patients and to those with increased risk (genetic, family history, elevated tumor markers or other high-risk factors) of ovarian cancer. Reference: JACR 2020 Feb; 17(2):248-254 Electronically Signed   By: Kimberley Penman M.D.   On: 05/28/2024 05:20     Procedures   Medications Ordered in the ED  cefTRIAXone  (ROCEPHIN ) 1 g in sodium chloride  0.9 % 100 mL IVPB (1 g Intravenous New Bag/Given 05/28/24 0600)  HYDROcodone-acetaminophen  (NORCO/VICODIN) 5-325 MG per tablet 2 tablet (2 tablets Oral Given 05/27/24 2154)  ketorolac  (TORADOL ) 15 MG/ML injection 15 mg (15 mg Intravenous Given 05/28/24 0329)  iohexol  (OMNIPAQUE ) 350 MG/ML injection 75 mL (75 mLs Intravenous Contrast Given 05/28/24 0501)                                    Medical Decision Making Amount and/or Complexity of Data Reviewed Labs: ordered. Radiology: ordered.  Risk Prescription drug management.   This patient presents to the  ED with chief complaint(s) of Abd pain .  The complaint involves an extensive differential diagnosis and also carries with it a high risk of complications and morbidity.   Pertinent past medical history as listed in HPI  The differential diagnosis includes  Cystitits, pyelonephritis,   Additional history obtained: Records reviewed Care Everywhere/External Records  Assessment and management:   Hemodynamically stable, nontoxic-appearing patient presenting with complaints of abdominal and flank pain, associated with dysuria and cloudy urine.  Was evaluated at urgent care yesterday.  UA was consistent with a UTI.  She was discharged on Macrobid.  Symptoms have worsened.  Not associated with nausea, vomiting or diarrhea.  She has no history of UTIs.  She does have left upper quadrant tenderness and positive left CVAT. she does have a notable leukocytosis, will obtain CT abdomen pelvis to evaluate for pyelonephritis.  CT was consistent with a UTI, negative for evidence of pyelonephritis.  However patient's clinical presentation including significant flank pain and leukocytosis is certainly concerning for pyelonephritis.  Using shared decision making, agreed to extend patient's  antibiotic course.  Prescription for bactrim  sent into her pharmacy.  Provided primary care follow-up.  Independent ECG interpretation:  none  Independent labs interpretation:  The following labs were independently interpreted:  CBC with leukocytosis of 16.6, BMP without significant abnormality, hCG negative  Independent visualization and interpretation of imaging: I independently visualized the following imaging with scope of interpretation limited to determining acute life threatening conditions related to emergency care:  CT abd pelvis bilateral ureteral wall thickening   Consultations obtained:   none  Disposition:   Patient will be discharged home. The patient has been appropriately medically screened and/or  stabilized in the ED. I have low suspicion for any other emergent medical condition which would require further screening, evaluation or treatment in the ED or require inpatient management. At time of discharge the patient is hemodynamically stable and in no acute distress. I have discussed work-up results and diagnosis with patient and answered all questions. Patient is agreeable with discharge plan. We discussed strict return precautions for returning to the emergency department and they verbalized understanding.     Social Determinants of Health:   Patient's impaired access to primary care  increases the complexity of managing their presentation  This note was dictated with voice recognition software.  Despite best efforts at proofreading, errors may have occurred which can change the documentation meaning.       Final diagnoses:  Acute cystitis without hematuria    ED Discharge Orders          Ordered    sulfamethoxazole -trimethoprim  (BACTRIM  DS) 800-160 MG tablet  2 times daily        05/28/24 0601               Felicie Horning, PA-C 05/28/24 0603    Palumbo, April, MD 05/28/24 [phone]

## 2024-05-28 NOTE — Discharge Instructions (Addendum)
 You were evaluated in the emergency room for abdominal pain and flank pain.  Your CT scan was consistent with a urinary tract infection.  Prescription for antibiotics was sent into your pharmacy.  Please be sure to complete the full course of antibiotics.

## 2024-05-29 LAB — URINE CULTURE: Culture: >=100000 — AB

## 2024-05-30 NOTE — Progress Notes (Signed)
 ED Antimicrobial Stewardship Positive Culture Follow Up   Alexandria [name] is an 23 y.o. female who presented to [facility] on @ADMITDT @ with a chief complaint of  Chief Complaint  Patient presents with   Flank Pain    Recent Results (from the past 720 hours)  Urine Culture     Status: Abnormal   Collection Time: 05/27/24  6:49 PM   Specimen: Urine, Clean Catch  Result Value Ref Range Status   Specimen Description URINE, CLEAN CATCH  Final   Special Requests   Final    NONE Performed at [facility], [address] [place], [place] [address]    Culture >=100,000 COLONIES/mL ESCHERICHIA COLI (A)  Final   Report Status 05/29/2024 FINAL  Final   Organism ID, Bacteria ESCHERICHIA COLI (A)  Final      Susceptibility   Escherichia coli - MIC*    AMPICILLIN >=32 RESISTANT Resistant     CEFAZOLIN  8 SENSITIVE Sensitive     CEFEPIME <=0.12 SENSITIVE Sensitive     CEFTRIAXONE  <=0.25 SENSITIVE Sensitive     CIPROFLOXACIN 0.5 INTERMEDIATE Intermediate     GENTAMICIN <=1 SENSITIVE Sensitive     IMIPENEM <=0.25 SENSITIVE Sensitive     NITROFURANTOIN  <=16 SENSITIVE Sensitive     TRIMETH /SULFA  >=320 RESISTANT Resistant     AMPICILLIN/SULBACTAM >=32 RESISTANT Resistant     PIP/TAZO >=128 RESISTANT Resistant ug/mL    * >=100,000 COLONIES/mL ESCHERICHIA COLI    [x]  Treated with bactrim , organism resistant to prescribed antimicrobial  STOP Bactrim  START: Cefpodoxime 200 mg BID x 10 days (Qty 20; Refills 0)  ED Provider: Darice Showers PA   Dorn Buttner, PharmD, BCPS 05/30/2024 10:46 AM ED Clinical Pharmacist -  [phone]

## 2024-11-07 ENCOUNTER — Encounter (HOSPITAL_COMMUNITY): Payer: Self-pay | Admitting: *Deleted

## 2024-11-07 ENCOUNTER — Other Ambulatory Visit: Payer: Self-pay

## 2024-11-07 ENCOUNTER — Emergency Department (HOSPITAL_COMMUNITY): Payer: Self-pay

## 2024-11-07 ENCOUNTER — Inpatient Hospital Stay (HOSPITAL_COMMUNITY): Payer: Self-pay

## 2024-11-07 ENCOUNTER — Inpatient Hospital Stay (HOSPITAL_COMMUNITY)
Admission: EM | Admit: 2024-11-07 | Discharge: 2024-11-07 | Disposition: A | Discharge status: Home / Self Care | Payer: Self-pay | Attending: Emergency Medicine | Admitting: Emergency Medicine

## 2024-11-07 DIAGNOSIS — R102 Pelvic and perineal pain unspecified side: Secondary | ICD-10-CM

## 2024-11-07 DIAGNOSIS — J069 Acute upper respiratory infection, unspecified: Secondary | ICD-10-CM | POA: Insufficient documentation

## 2024-11-07 DIAGNOSIS — O039 Complete or unspecified spontaneous abortion without complication: Secondary | ICD-10-CM | POA: Diagnosis not present

## 2024-11-07 DIAGNOSIS — I959 Hypotension, unspecified: Secondary | ICD-10-CM | POA: Diagnosis not present

## 2024-11-07 DIAGNOSIS — Z743 Need for continuous supervision: Secondary | ICD-10-CM | POA: Diagnosis not present

## 2024-11-07 DIAGNOSIS — M546 Pain in thoracic spine: Secondary | ICD-10-CM

## 2024-11-07 DIAGNOSIS — O26891 Other specified pregnancy related conditions, first trimester: Secondary | ICD-10-CM | POA: Diagnosis present

## 2024-11-07 DIAGNOSIS — M5459 Other low back pain: Secondary | ICD-10-CM | POA: Diagnosis not present

## 2024-11-07 DIAGNOSIS — O3680X0 Pregnancy with inconclusive fetal viability, not applicable or unspecified: Secondary | ICD-10-CM | POA: Diagnosis present

## 2024-11-07 DIAGNOSIS — R1031 Right lower quadrant pain: Secondary | ICD-10-CM | POA: Diagnosis not present

## 2024-11-07 LAB — COMPREHENSIVE METABOLIC PANEL WITH GFR
ALT: 11 U/L (ref 0–44)
AST: 25 U/L (ref 15–41)
Albumin: 4.7 g/dL (ref 3.5–5.0)
Alkaline Phosphatase: 75 U/L (ref 38–126)
Anion gap: 13 (ref 5–15)
BUN: 15 mg/dL (ref 6–20)
CO2: 23 mmol/L (ref 22–32)
Calcium: 9.6 mg/dL (ref 8.9–10.3)
Chloride: 103 mmol/L (ref 98–111)
Creatinine, Ser: 0.7 mg/dL (ref 0.44–1.00)
GFR, Estimated: >60 mL/min (ref >60)
Glucose, Bld: 126 mg/dL — ABNORMAL HIGH (ref 70–99)
Potassium: 3.9 mmol/L (ref 3.5–5.1)
Sodium: 138 mmol/L (ref 135–145)
Total Bilirubin: 0.5 mg/dL (ref 0.0–1.2)
Total Protein: 7.2 g/dL (ref 6.5–8.1)

## 2024-11-07 LAB — URINALYSIS, ROUTINE W REFLEX MICROSCOPIC
Bilirubin Urine: NEGATIVE
Glucose, UA: NEGATIVE mg/dL
Ketones, ur: NEGATIVE mg/dL
Nitrite: POSITIVE — AB
Protein, ur: 30 mg/dL — AB
Specific Gravity, Urine: 1.015 (ref 1.005–1.030)
pH: 7 (ref 5.0–8.0)

## 2024-11-07 LAB — CBC
HCT: 40 % (ref 36.0–46.0)
Hemoglobin: 13.8 g/dL (ref 12.0–15.0)
MCH: 29.9 pg (ref 26.0–34.0)
MCHC: 34.5 g/dL (ref 30.0–36.0)
MCV: 86.8 fL (ref 80.0–100.0)
Platelets: 288 K/uL (ref 150–400)
RBC: 4.61 MIL/uL (ref 3.87–5.11)
RDW: 12.6 % (ref 11.5–15.5)
WBC: 13.4 K/uL — ABNORMAL HIGH (ref 4.0–10.5)
nRBC: 0 % (ref 0.0–0.2)

## 2024-11-07 LAB — RESP PANEL BY RT-PCR (RSV, FLU A&B, COVID)  RVPGX2
Influenza A by PCR: NEGATIVE
Influenza B by PCR: NEGATIVE
Resp Syncytial Virus by PCR: NEGATIVE
SARS Coronavirus 2 by RT PCR: NEGATIVE

## 2024-11-07 LAB — URINALYSIS, MICROSCOPIC (REFLEX): RBC / HPF: >50 RBC/hpf (ref 0–5)

## 2024-11-07 LAB — HCG, QUANTITATIVE, PREGNANCY: hCG, Beta Chain, Quant, S: 74 m[IU]/mL — ABNORMAL HIGH (ref <5)

## 2024-11-07 LAB — PREGNANCY, URINE: Preg Test, Ur: NEGATIVE

## 2024-11-07 LAB — LIPASE, BLOOD: Lipase: 31 U/L (ref 11–51)

## 2024-11-07 MED ORDER — HYDROMORPHONE HCL 1 MG/ML IJ SOLN
1.0000 mg | Freq: Once | INTRAMUSCULAR | Status: AC
  Administered 2024-11-07: 1 mg via INTRAVENOUS
  Filled 2024-11-07: qty 1

## 2024-11-07 MED ORDER — MORPHINE SULFATE (PF) 4 MG/ML IV SOLN
4.0000 mg | Freq: Once | INTRAVENOUS | Status: AC
  Administered 2024-11-07: 4 mg via INTRAVENOUS
  Filled 2024-11-07: qty 1

## 2024-11-07 MED ORDER — KETOROLAC TROMETHAMINE 30 MG/ML IJ SOLN
15.0000 mg | Freq: Once | INTRAMUSCULAR | Status: AC
  Administered 2024-11-07: 15 mg via INTRAVENOUS
  Filled 2024-11-07: qty 1

## 2024-11-07 MED ORDER — IBUPROFEN 800 MG PO TABS: 800.0000 mg | ORAL_TABLET | Freq: Three times a day (TID) | ORAL | 0 refills | Status: DC | PRN

## 2024-11-07 MED ORDER — ACETAMINOPHEN 500 MG PO TABS
1000.0000 mg | ORAL_TABLET | Freq: Once | ORAL | Status: DC
  Filled 2024-11-07: qty 2

## 2024-11-07 MED ORDER — ONDANSETRON HCL 4 MG/2ML IJ SOLN
4.0000 mg | Freq: Once | INTRAMUSCULAR | Status: AC
  Administered 2024-11-07: 4 mg via INTRAVENOUS
  Filled 2024-11-07: qty 2

## 2024-11-07 NOTE — MAU Provider Note (Signed)
 Maternal Assessment Unit Provider Note  Subjective: [name] is a 23 y.o. [id number] pregnant female at [redacted]w[redacted]d who transferred to the MAU today with complaint of back pain, abdominal pain, vaginal bleeding.   Last menstrual period was in early August.  She had a couple negative pregnancy tests and then on October 10 she had a positive pregnancy test.  She felt like she was pregnant as she had significant weight gain since then.  She is currently in school so had not sought prenatal care yet.  She was in her usual state of health until 5 days prior to presentation she was at school walking around and started having severe pain in her right lower quadrant with sweating.  Then the days after that she felt like she was going to have her period.  Yesterday she woke up with heavy bleeding, had straining and passed white-colored tissue, and had persistent lower back pain and suprapubic pelvic cramps.   Early yesterday evening she went to the bathroom and could not get up due to pain.  She took ibuprofen  and TheraFlu tea yesterday.  She reports a fever and headache due to current upper respiratory infection.  She has associated bowel urgency.  No urinary frequency urgency or dysuria.  She presented to the [facility] long ER where hCG was demonstrated to be 74.  No IUP demonstrated on ultrasound though there was thickened heterogenous endometrium noted in the lower uterine segment/cervix.  No adnexal mass or abnormal appearance of the ovaries with trace free fluid in the pelvis.  She was sent to the MAU due to continued tubular pain after being given 4 mg of morphine  and 1 mg of Dilaudid .  Pain upon presentation to the ER was 10 out of 10 currently here in the MAU is 5 out of 10 which she describes as Period Type pain.  Prior ER notes review.  Pertinent items noted in HPI and remainder of comprehensive ROS otherwise negative.   Objective: BP 103/65 (BP Location: Left Arm)   Pulse 83   Temp  98.6 F (37 C) (Oral)   Resp 18   LMP 10/04/2024   SpO2 99%  Physical Exam Vitals and nursing note reviewed. Exam conducted with a chaperone present.  Constitutional:      General: She is not in acute distress.    Appearance: She is well-developed. She is not diaphoretic.  HENT:     Head: Normocephalic and atraumatic.  Eyes:     General: No scleral icterus. Cardiovascular:     Rate and Rhythm: Normal rate and regular rhythm.     Heart sounds: No murmur heard.    No friction rub. No gallop.  Pulmonary:     Effort: Pulmonary effort is normal. No respiratory distress.     Breath sounds: Normal breath sounds.  Abdominal:     General: Bowel sounds are normal. There is no distension.     Palpations: Abdomen is soft.     Tenderness: There is abdominal tenderness in the right lower quadrant, suprapubic area and left lower quadrant. There is left CVA tenderness. There is no right CVA tenderness, guarding or rebound. Negative signs include McBurney's sign and obturator sign.  Genitourinary:    Comments: Vaginal vault is notable for moderate amount of clotted blood.  Cervical os os is dilated with tissue extruding from it.  Purple tissue with appearance of products of conception was gently removed with ring forceps and sent to pathology in specimen cup. Skin:  General: Skin is warm and dry.  Neurological:     Mental Status: She is alert.     Coordination: Coordination normal.    Results for orders placed or performed during the hospital encounter of 11/07/24 (from the past 24 hours)  CBC     Status: Abnormal   Collection Time: 11/07/24 12:38 AM  Result Value Ref Range   WBC 13.4 (H) 4.0 - 10.5 K/uL   RBC 4.61 3.87 - 5.11 MIL/uL   Hemoglobin 13.8 12.0 - 15.0 g/dL   HCT 59.9 63.9 - 53.9 %   MCV 86.8 80.0 - 100.0 fL   MCH 29.9 26.0 - 34.0 pg   MCHC 34.5 30.0 - 36.0 g/dL   RDW 87.3 88.4 - 84.4 %   Platelets 288 150 - 400 K/uL   nRBC 0.0 0.0 - 0.2 %  Comprehensive metabolic panel  with GFR     Status: Abnormal   Collection Time: 11/07/24  1:26 AM  Result Value Ref Range   Sodium 138 135 - 145 mmol/L   Potassium 3.9 3.5 - 5.1 mmol/L   Chloride 103 98 - 111 mmol/L   CO2 23 22 - 32 mmol/L   Glucose, Bld 126 (H) 70 - 99 mg/dL   BUN 15 6 - 20 mg/dL   Creatinine, Ser 9.29 0.44 - 1.00 mg/dL   Calcium  9.6 8.9 - 10.3 mg/dL   Total Protein 7.2 6.5 - 8.1 g/dL   Albumin 4.7 3.5 - 5.0 g/dL   AST 25 15 - 41 U/L   ALT 11 0 - 44 U/L   Alkaline Phosphatase 75 38 - 126 U/L   Total Bilirubin 0.5 0.0 - 1.2 mg/dL   GFR, Estimated >39 >39 mL/min   Anion gap 13 5 - 15  hCG, quantitative, pregnancy     Status: Abnormal   Collection Time: 11/07/24  1:26 AM  Result Value Ref Range   hCG, Beta Chain, Quant, S 74 (H) <5 mIU/mL  Lipase, blood     Status: None   Collection Time: 11/07/24  1:26 AM  Result Value Ref Range   Lipase 31 11 - 51 U/L  Resp panel by RT-PCR (RSV, Flu A&B, Covid) Anterior Nasal Swab     Status: None   Collection Time: 11/07/24  6:18 AM   Specimen: Anterior Nasal Swab  Result Value Ref Range   SARS Coronavirus 2 by RT PCR NEGATIVE NEGATIVE   Influenza A by PCR NEGATIVE NEGATIVE   Influenza B by PCR NEGATIVE NEGATIVE   Resp Syncytial Virus by PCR NEGATIVE NEGATIVE  Pregnancy, urine     Status: None   Collection Time: 11/07/24  7:23 AM  Result Value Ref Range   Preg Test, Ur NEGATIVE NEGATIVE  Urinalysis, Routine w reflex microscopic -Urine, Clean Catch     Status: Abnormal   Collection Time: 11/07/24  7:23 AM  Result Value Ref Range   Color, Urine ORANGE (A) YELLOW   APPearance CLOUDY (A) CLEAR   Specific Gravity, Urine 1.015 1.005 - 1.030   pH 7.0 5.0 - 8.0   Glucose, UA NEGATIVE NEGATIVE mg/dL   Hgb urine dipstick LARGE (A) NEGATIVE   Bilirubin Urine NEGATIVE NEGATIVE   Ketones, ur NEGATIVE NEGATIVE mg/dL   Protein, ur 30 (A) NEGATIVE mg/dL   Nitrite POSITIVE (A) NEGATIVE   Leukocytes,Ua MODERATE (A) NEGATIVE  Urinalysis, Microscopic (reflex)      Status: Abnormal   Collection Time: 11/07/24  7:23 AM  Result Value Ref Range  RBC / HPF >50 0 - 5 RBC/hpf   WBC, UA 11-20 0 - 5 WBC/hpf   Bacteria, UA MANY (A) NONE SEEN   Squamous Epithelial / HPF 11-20 0 - 5 /HPF    MDM: High risk  MAU Course: 8:23 AM Upon arrival to the MAU.  Patient was alert, giving appropriate history with normal vital signs.  Interview and physical completed.  Small amount of tissue removed from the mildly dilated cervical os and sent to pathology. This as well as the remainder of the history is consistent with spontaneous abortion.  She was given 15 mg of Toradol  IV, and she declined acetaminophen .  On history she reports continued significant back pain as well as exhibiting significant left-sided CVA tenderness so UA ordered and renal ultrasound ordered.  Respiratory viral swab completed as well.  8:23 AM Reassessment: Patient back from US . Reports her pain is improved to about 3 out of 10.  Feeling better. Respiratory virus panel negative. Renal US  pending/UA pending--pt has not urinated yet. Discussed follow-up plan for likely SAB with Dr. Abigail. She recommends 48hr follow up bHCG. This was scheduled at [facility] for 11/09/24.   8:23 AM Renal US  unremarkable, UA pending.  8:23 AM UA not definitive. Cx sent. Discussed with patient, clinical picture is consistent with SAB. Please follow up for 48hr bHCG. Ibuprofen  sent for pain control. Pt feeling better with 2 out of 10 pain level.   Questions were answered to the satisfaction of the patient and/or family prior to discharge.   Assessment    ICD-10-CM   1. Spontaneous abortion  O03.9     2. Acute thoracic back pain, unspecified back pain laterality  M54.6     3. Pelvic pain affecting pregnancy in first trimester, antepartum  O26.891    R10.20     4. Viral upper respiratory infection  J06.9       Plan Discharge with prescription for Ibuprofen  800mg  Q8hrs PRN Follow up [facility] on 12/02 as scheduled  for followup of SAB Supportive care for URI  Discharge from MAU in stable condition with strict ER return precautions   Allergies as of 11/07/2024       Reactions   Pineapple Anaphylaxis   Throat closing    Other Swelling   cilantro        Medication List     TAKE these medications    ibuprofen  800 MG tablet Commonly known as: ADVIL  Take 1 tablet (800 mg total) by mouth every 8 (eight) hours as needed (Pain). What changed:  when to take this reasons to take this   MULTIPLE VITAMINS/WOMENS PO Take 1 tablet by mouth daily.        Trudy Leeroy NOVAK, MD 11/07/2024 8:23 AM

## 2024-11-07 NOTE — ED Provider Notes (Signed)
 [facility]-EMERGENCY DEPT [facility] Emergency Department Provider Note MRN:  [id number]  Arrival date & time: 11/07/24     Chief Complaint   Abdominal Pain and Back Pain   History of Present Illness   Alexandria [name] is a 23 y.o. year-old female presents to the ED with chief complaint of suprapubic abdominal pain as well as right sided flank pain and right low back pain.  She states that she has been having menstrual cramps for the past 3 days, but states that the right sided flank and back pain acutely worsened today.  She has not been able to tolerate the pain and has not been able to control it at home.  She states that she has on her period.  She denies fevers or chills.  Denies nausea or vomiting.  History provided by patient.   Review of Systems  Pertinent positive and negative review of systems noted in HPI.    Physical Exam   Vitals:   11/07/24 0403 11/07/24 0434  BP: 110/70   Pulse: 73   Resp: 18   Temp:  98 F (36.7 C)  SpO2: 98%     CONSTITUTIONAL:  uncomfortable-appearing, NAD NEURO:  Alert and oriented x 3, CN 3-12 grossly intact EYES:  eyes equal and reactive ENT/NECK:  Supple, no stridor  CARDIO:  normal rate, regular rhythm, appears well-perfused  PULM:  No respiratory distress, CTAB GI/GU:  non-distended, right pelvic tenderness MSK/SPINE:  No gross deformities, no edema, moves all extremities  SKIN:  no rash, atraumatic   *Additional and/or pertinent findings included in MDM below  Diagnostic and Interventional Summary    EKG Interpretation Date/Time:    Ventricular Rate:    PR Interval:    QRS Duration:    QT Interval:    QTC Calculation:   R Axis:      Text Interpretation:         Labs Reviewed  CBC - Abnormal; Notable for the following components:      Result Value   WBC 13.4 (*)    All other components within normal limits  COMPREHENSIVE METABOLIC PANEL WITH GFR - Abnormal; Notable for the following components:    Glucose, Bld 126 (*)    All other components within normal limits  HCG, QUANTITATIVE, PREGNANCY - Abnormal; Notable for the following components:   hCG, Beta Chain, Quant, S 74 (*)    All other components within normal limits  LIPASE, BLOOD  URINALYSIS, ROUTINE W REFLEX MICROSCOPIC  PREGNANCY, URINE    US  OB LESS THAN 14 WEEKS WITH OB TRANSVAGINAL  Final Result      Medications  HYDROmorphone  (DILAUDID ) injection 1 mg (1 mg Intravenous Given 11/07/24 0107)  ondansetron  (ZOFRAN ) injection 4 mg (4 mg Intravenous Given 11/07/24 0144)  morphine  (PF) 4 MG/ML injection 4 mg (4 mg Intravenous Given 11/07/24 0144)     Procedures  /  Critical Care Procedures  ED Course and Medical Decision Making  I have reviewed the triage vital signs, the nursing notes, and pertinent available records from the EMR.  Social Determinants Affecting Complexity of Care: Patient has no clinically significant social determinants affecting this chief complaint..   ED Course:    Medical Decision Making Patient presents with right lower abdominal pain and right flank pain.  She is also on her menstrual cycle, which started 2 days ago.  She states the last menstrual cycle was August 8.  She states that she is normally very regular.  She does  not currently have an OB/GYN, but did see women's and children's/[facility] faculty back in 2019.  hCG is positive, but low at 74.  Ultrasound does not identify an intrauterine pregnancy.  She has been difficult to pain control in the ED.  She is not hypotensive.  She does have mild leukocytosis at 13.4.  She does not appear toxic, but does appear uncomfortable.  I have concern for ectopic.  Will consult with GYN.  Amount and/or Complexity of Data Reviewed Labs: ordered. Radiology: ordered.  Risk Prescription drug management. Decision regarding hospitalization.         Consultants: I consulted with Dr. Abigail, OB/GYN, who is appreciated for accepting patient in  transfer for observation and further evaluation.  Patient does not currently have an OBGYN.     Treatment and Plan: Patient's exam and diagnostic results are concerning for ectopic pregnancy v miscarriage with intractable pain.  Feel that patient will need admission to the hospital for further treatment and evaluation.  Patient discussed with attending physician, Dr. Trine, who agrees with plan.  Final Clinical Impressions(s) / ED Diagnoses     ICD-10-CM   1. Pregnancy of unknown anatomic location  O36.80X0       ED Discharge Orders     None         Discharge Instructions Discussed with and Provided to Patient:   Discharge Instructions   None      Vicky Charleston, PA-C 11/07/24 0441    Trine Raynell Moder, MD 11/07/24 [phone]

## 2024-11-07 NOTE — ED Triage Notes (Signed)
 Pt started having heavy period two days ago with lower back pain. Reports back pain radiates into abdomen. Has a positive home pregnancy test two weeks ago then recently took another test and tested negative. LMP 3 weeks ago

## 2024-11-07 NOTE — ED Notes (Signed)
 Pt transported to US 

## 2024-11-07 NOTE — ED Notes (Signed)
 Pt restless, having to pace back and forth due to pain. Unable to provide urine sample at this time. Given 1 cup or water then informed pt of NPO status

## 2024-11-08 ENCOUNTER — Inpatient Hospital Stay (HOSPITAL_COMMUNITY)
Admission: AD | Admit: 2024-11-08 | Discharge: 2024-11-09 | Disposition: A | Payer: Self-pay | Attending: Obstetrics and Gynecology | Admitting: Obstetrics and Gynecology

## 2024-11-08 ENCOUNTER — Other Ambulatory Visit: Payer: Self-pay

## 2024-11-08 ENCOUNTER — Encounter (HOSPITAL_COMMUNITY): Payer: Self-pay | Admitting: Obstetrics and Gynecology

## 2024-11-08 ENCOUNTER — Inpatient Hospital Stay (HOSPITAL_COMMUNITY): Payer: Self-pay

## 2024-11-08 DIAGNOSIS — O039 Complete or unspecified spontaneous abortion without complication: Secondary | ICD-10-CM | POA: Insufficient documentation

## 2024-11-08 DIAGNOSIS — R109 Unspecified abdominal pain: Secondary | ICD-10-CM

## 2024-11-08 DIAGNOSIS — N3000 Acute cystitis without hematuria: Secondary | ICD-10-CM

## 2024-11-08 DIAGNOSIS — N939 Abnormal uterine and vaginal bleeding, unspecified: Secondary | ICD-10-CM

## 2024-11-08 MED ORDER — HYDROMORPHONE HCL 1 MG/ML IJ SOLN
1.0000 mg | Freq: Once | INTRAMUSCULAR | Status: AC
  Administered 2024-11-08: 1 mg via INTRAVENOUS
  Filled 2024-11-08: qty 1

## 2024-11-08 MED ORDER — ONDANSETRON HCL 4 MG/2ML IJ SOLN
4.0000 mg | Freq: Once | INTRAMUSCULAR | Status: AC
  Administered 2024-11-08: 4 mg via INTRAVENOUS
  Filled 2024-11-08: qty 2

## 2024-11-08 NOTE — MAU Note (Signed)
 Alexandria [name] is a 23 y.o. at [redacted]w[redacted]d here in MAU reporting: dx with SAB yesterday morning - has been sore, but about 30 minutes ago was sitting on the toilet and started having contraction like pain in lower abdomen/pelvic area. Reports just changing her pad that she's had on since 1830 and it was saturated - blood is dark. Took ibuprofen  2 hours ago.   Onset of complaint: 2245 Pain score: 9 Vitals:   11/08/24 2314  BP: 93/78  Pulse: 99  Resp: 20  Temp: 98.7 F (37.1 C)  SpO2: 100%     FHT: NA  Lab orders placed from triage: none

## 2024-11-08 NOTE — MAU Provider Note (Signed)
 Event Date/Time   First Provider Initiated Contact with Patient 11/08/24 2323      S/HPI [name] is a 23 y.o. [phone] patient who presents to MAU today with complaint of is approximately [redacted] weeks pregnant seen in MAU on 1130 and diagnosed with a spontaneous abortion.  Patient reports today she has been having severe cramping and vaginal bleeding.  She reports changing her pad since about 1830 which was saturated with dark blood.  She took ibuprofen  800 mg approximately 2 hours ago with no relief and reporting her pain score to be 9 out of 10.  Patient was also complaining of some urinary signs and symptoms a urine culture was sent on 11/30 and per review of chart preliminary results show UTI with E. coli.   The remainder the ROS is negative unless otherwise noted in HPI.    O BP 106/66 (BP Location: Right Arm)   Pulse 72   Temp 98.7 F (37.1 C) (Oral)   Resp 20   Ht 4' 11 (1.499 m)   Wt 60.8 kg   LMP 10/04/2024   SpO2 100%   BMI 27.06 kg/m  Physical Exam Vitals and nursing note reviewed. Exam conducted with a chaperone present.  Constitutional:      General: She is not in acute distress.    Appearance: She is well-developed. She is not ill-appearing.  HENT:     Head: Normocephalic.  Cardiovascular:     Rate and Rhythm: Normal rate.  Pulmonary:     Effort: Pulmonary effort is normal.  Abdominal:     Palpations: Abdomen is soft.     Tenderness: There is abdominal tenderness. There is no guarding or rebound.  Genitourinary:    Comments: Pelvic declined by patient  Musculoskeletal:     Cervical back: Normal range of motion.  Skin:    General: Skin is warm.  Neurological:     Mental Status: She is alert and oriented to person, place, and time.  Psychiatric:        Mood and Affect: Mood is anxious.        Behavior: Behavior is agitated.   Pelvic Declined   MDM  HIGH  Prenatal chart reviewed including notes from 11/07/2024 Physical exam  performed OB imaging ordered CBC due to acute blood loss ( Stable Hgb 13.5) IV lock in place Dilaudid  1 mg IVP  for acute pain management  Toradol  IV push for pain not resolved with Dilaudid  OB Imaging results reviewed independently (no evidence of retained products.  There is a blood collection in the lower uterine segment)   SAB/Complete  Residual blood clots and cramping s/p Miscarriage Will plan to D/C home with pain management and methergine  to control bleeding RX also sent for preliminary report of Urine CX with Ecoli (11/07/24) Hcg done today F/U in office in 1 week s/p Miscarriage  Orders Placed This Encounter  Procedures   US  OB Transvaginal    Standing Status:   Standing    Number of Occurrences:   1    Symptom/Reason for Exam:   Miscarriage [[id number]]   CBC    Standing Status:   Standing    Number of Occurrences:   1   Comprehensive metabolic panel    Standing Status:   Standing    Number of Occurrences:   1   hCG, quantitative, pregnancy    Standing Status:   Standing    Number of Occurrences:   1   Insert peripheral  IV    Standing Status:   Standing    Number of Occurrences:   1   Discharge patient Discharge disposition: 01-Home or Self Care; Discharge patient date: 11/09/2024    Standing Status:   Standing    Number of Occurrences:   1    Discharge disposition:   01-Home or Self Care [1]    Discharge patient date:   11/09/2024      Results for orders placed or performed during the hospital encounter of 11/08/24 (from the past 24 hours)  CBC     Status: None   Collection Time: 11/08/24 11:41 PM  Result Value Ref Range   WBC 8.5 4.0 - 10.5 K/uL   RBC 4.45 3.87 - 5.11 MIL/uL   Hemoglobin 13.5 12.0 - 15.0 g/dL   HCT 61.6 63.9 - 53.9 %   MCV 86.1 80.0 - 100.0 fL   MCH 30.3 26.0 - 34.0 pg   MCHC 35.2 30.0 - 36.0 g/dL   RDW 87.5 88.4 - 84.4 %   Platelets 312 150 - 400 K/uL   nRBC 0.0 0.0 - 0.2 %  Comprehensive metabolic panel     Status: Abnormal    Collection Time: 11/08/24 11:41 PM  Result Value Ref Range   Sodium 137 135 - 145 mmol/L   Potassium 4.2 3.5 - 5.1 mmol/L   Chloride 103 98 - 111 mmol/L   CO2 23 22 - 32 mmol/L   Glucose, Bld 105 (H) 70 - 99 mg/dL   BUN 15 6 - 20 mg/dL   Creatinine, Ser 9.19 0.44 - 1.00 mg/dL   Calcium  8.7 (L) 8.9 - 10.3 mg/dL   Total Protein 6.7 6.5 - 8.1 g/dL   Albumin 4.1 3.5 - 5.0 g/dL   AST 31 15 - 41 U/L   ALT 19 0 - 44 U/L   Alkaline Phosphatase 64 38 - 126 U/L   Total Bilirubin 0.8 0.0 - 1.2 mg/dL   GFR, Estimated >39 >39 mL/min   Anion gap 11 5 - 15    Status: Preliminary result     Next appt: Today at 10:35 AM in Obstetrics and Gynecology [facility] FU)   Test Result Released: No   Specimen Information: Urine, Random  0 Result Notes    Component Ref Range & Units (hover) 2 d ago  Specimen Description URINE, RANDOM  Special Requests NONE  Culture  Abnormal  >=100,000 COLONIES/mL ESCHERICHIA COLI SUSCEPTIBILITIES TO FOLLOW NO GROUP B STREP (S.AGALACTIAE) ISOLATED Performed at [facility], [address] [place], [place] [address]     Study Result  Narrative & Impression  EXAM: OBSTETRIC ULTRASOUND FIRST TRIMESTER   TECHNIQUE: Transabdominal and transvaginal first trimester obstetric pelvic duplex ultrasound was performed with real-time imaging, color flow Doppler imaging.   COMPARISON: None available.   CLINICAL HISTORY: Abdominal pain.   FINDINGS:   UTERUS: Thickened mildly heterogeneous endometrium measuring up to 13 mm in the lower uterine segment / cervix. No focal myometrial mass.   GESTATIONAL SAC(S): No intrauterine gestational sac. No subchorionic hemorrhage.   YOLK SAC: No yolk sac.   EMBRYO(<11WK) /FETUS(>=11WK): No embryo or fetus.   CROWN RUMP LENGTH: Not measured.   RATE OF CARDIAC ACTIVITY: No cardiac activity.   RIGHT OVARY: Unremarkable. Normal arterial and venous flow.   LEFT OVARY: Unremarkable. Normal arterial and venous  flow.   FREE FLUID: Trace free fluid in the pelvis.   MEASUREMENTS   ESTIMATED GESTATIONAL AGE BY CURRENT ULTRASOUND: Not applicable (no  intrauterine gestational sac).   ESTIMATED GESTATIONAL AGE BY LMP/PRIOR ULTRASOUND: Not provided.   ESTIMATED DUE DATE: Not applicable (no intrauterine gestational sac).   IMPRESSION: 1. No visualized intrauterine gestational sac. In the setting of positive pregnancy test, this reflects a pregnancy of unknown location. Differential considerations include early normal IUP, abnormal IUP, or nonvisualized ectopic pregnancy. Differentiation is achieved with serial beta HCG supplemented by repeat sonography as clinically warranted. 2. Thickened mildly heterogeneous endometrium measuring up to 13 mm in the lower uterine segment/cervix.   Electronically signed by: Norman Gatlin MD 11/07/2024 03:55 AM EST RP Workstation: [id number]    I have reviewed the patient chart and performed the physical exam . I have ordered & interpreted the lab results and reviewed and interpreted the ultrasound images and agree with the radiologist findings . Complete miscarriage with no evidence of rPOC at this time. Residual blood in the lower uterine segment  Medications ordered as stated below.  A/P as described below.  Counseling and education provided and patient agreeable  with plan as described below. Verbalized understanding.     Reassured patient that miscarriage is common with ~1/4 of women experiencing it in their lifetime. Reassured patient that there is nothing she did or did not do to cause this. Reviewed most common reason is presumed to be genetic abnormalities that allow a pregnancy to start but not continue past an early stage, but realistically we do not know the cause in most cases. Reviewed that studies show no definite difference between attempting another pregnancy sooner vs waiting, though some studies do show better live birth outcomes with trying  sooner. Reviewed options of expectant, medical, or surgical management. After counseling she elected for expectant management. Reviewed that cramping, bleeding are normal. Reviewed warning signs of heavy vaginal bleeding soaking through >1 pad per hour, crescendo abdominal pain, and fever. . Blood type  , rhogam was not indicated.  We discussed return precautions including crescendo abdominal pain, heavy vaginal bleeding soaking >1 pad/hour, and fever.   ASSESSMENT Medical screening exam complete Miscarriage at 8 to [redacted] weeks gestation  Vaginal bleeding  Abdominal cramping  Acute cystitis without hematuria    PLAN  Discharge from MAU in stable condition  F/U in 1 week for miscarriage ( Message sent  to the office)  See AVS for full description of educational information and instructions provided to the patient at time of discharge  Warning signs for worsening condition that would warrant emergency follow-up discussed  Patient may return to MAU as needed   Allergies as of 11/09/2024       Reactions   Fluoride Anaphylaxis   Pineapple Anaphylaxis   Throat closing    Other Swelling   cilantro        Medication List     TAKE these medications    ibuprofen  800 MG tablet Commonly known as: ADVIL  Take 1 tablet (800 mg total) by mouth every 8 (eight) hours as needed for moderate pain (pain score 4-6) (Pain). What changed: reasons to take this   methylergonovine 0.2 MG tablet Commonly known as: METHERGINE  Take 1 tablet (0.2 mg total) by mouth every 6 (six) hours as needed for up to 1 day.   MULTIPLE VITAMINS/WOMENS PO Take 1 tablet by mouth daily.   nitrofurantoin  (macrocrystal-monohydrate) 100 MG capsule Commonly known as: MACROBID  Take 1 capsule (100 mg total) by mouth 2 (two) times daily.   oxycodone  5 MG capsule Commonly known as: OXY-IR Take 2 capsules (10 mg total) by  mouth every 6 (six) hours as needed for up to 2 days.         ----------------------------------------------------------------------------  Olam Dalton, MSN, [facility], Center for [facility]   This chart was dictated using voice recognition software, Dragon. Despite the best efforts of this provider to proofread and correct errors, errors may still occur which can change documentation meaning.

## 2024-11-09 ENCOUNTER — Ambulatory Visit: Payer: Self-pay

## 2024-11-09 ENCOUNTER — Ambulatory Visit: Payer: Self-pay | Admitting: Nurse Practitioner

## 2024-11-09 ENCOUNTER — Telehealth: Payer: Self-pay | Admitting: Family Medicine

## 2024-11-09 DIAGNOSIS — O039 Complete or unspecified spontaneous abortion without complication: Secondary | ICD-10-CM | POA: Diagnosis not present

## 2024-11-09 DIAGNOSIS — N939 Abnormal uterine and vaginal bleeding, unspecified: Secondary | ICD-10-CM | POA: Diagnosis not present

## 2024-11-09 DIAGNOSIS — Z3A01 Less than 8 weeks gestation of pregnancy: Secondary | ICD-10-CM | POA: Diagnosis not present

## 2024-11-09 DIAGNOSIS — R109 Unspecified abdominal pain: Secondary | ICD-10-CM | POA: Diagnosis not present

## 2024-11-09 LAB — COMPREHENSIVE METABOLIC PANEL WITH GFR
ALT: 19 U/L (ref 0–44)
AST: 31 U/L (ref 15–41)
Albumin: 4.1 g/dL (ref 3.5–5.0)
Alkaline Phosphatase: 64 U/L (ref 38–126)
Anion gap: 11 (ref 5–15)
BUN: 15 mg/dL (ref 6–20)
CO2: 23 mmol/L (ref 22–32)
Calcium: 8.7 mg/dL — ABNORMAL LOW (ref 8.9–10.3)
Chloride: 103 mmol/L (ref 98–111)
Creatinine, Ser: 0.8 mg/dL (ref 0.44–1.00)
GFR, Estimated: >60 mL/min (ref >60)
Glucose, Bld: 105 mg/dL — ABNORMAL HIGH (ref 70–99)
Potassium: 4.2 mmol/L (ref 3.5–5.1)
Sodium: 137 mmol/L (ref 135–145)
Total Bilirubin: 0.8 mg/dL (ref 0.0–1.2)
Total Protein: 6.7 g/dL (ref 6.5–8.1)

## 2024-11-09 LAB — CULTURE, OB URINE: Culture: >=100000 — AB

## 2024-11-09 LAB — CBC
HCT: 38.3 % (ref 36.0–46.0)
Hemoglobin: 13.5 g/dL (ref 12.0–15.0)
MCH: 30.3 pg (ref 26.0–34.0)
MCHC: 35.2 g/dL (ref 30.0–36.0)
MCV: 86.1 fL (ref 80.0–100.0)
Platelets: 312 K/uL (ref 150–400)
RBC: 4.45 MIL/uL (ref 3.87–5.11)
RDW: 12.4 % (ref 11.5–15.5)
WBC: 8.5 K/uL (ref 4.0–10.5)
nRBC: 0 % (ref 0.0–0.2)

## 2024-11-09 LAB — HCG, QUANTITATIVE, PREGNANCY: hCG, Beta Chain, Quant, S: 33 m[IU]/mL — ABNORMAL HIGH (ref <5)

## 2024-11-09 MED ORDER — NITROFURANTOIN MONOHYD MACRO 100 MG PO CAPS: 100.0000 mg | ORAL_CAPSULE | Freq: Two times a day (BID) | ORAL | 0 refills | Status: DC

## 2024-11-09 MED ORDER — OXYCODONE HCL 5 MG PO CAPS: 10.0000 mg | ORAL_CAPSULE | Freq: Four times a day (QID) | ORAL | 0 refills | Status: AC | PRN

## 2024-11-09 MED ORDER — METHYLERGONOVINE MALEATE 0.2 MG PO TABS: 0.2000 mg | ORAL_TABLET | Freq: Four times a day (QID) | ORAL | 0 refills | Status: AC | PRN

## 2024-11-09 MED ORDER — KETOROLAC TROMETHAMINE 30 MG/ML IJ SOLN
30.0000 mg | Freq: Once | INTRAMUSCULAR | Status: AC
  Administered 2024-11-09: 30 mg via INTRAVENOUS
  Filled 2024-11-09: qty 1

## 2024-11-09 MED ORDER — IBUPROFEN 800 MG PO TABS: 800.0000 mg | ORAL_TABLET | Freq: Three times a day (TID) | ORAL | 0 refills | Status: DC | PRN

## 2024-11-09 NOTE — Telephone Encounter (Signed)
 Called pt and left detailed message about canceling today's appt and having follow up appointment with provider next week on 12/11. Left VM for pt to call if she has any questions or need to reschedule.

## 2024-11-10 ENCOUNTER — Telehealth: Payer: Self-pay | Admitting: *Deleted

## 2024-11-10 LAB — SURGICAL PATHOLOGY

## 2024-11-10 NOTE — Telephone Encounter (Signed)
 Received a phone call from pathology reporting they were sent products of conception but on exam they did not see any chorionic villa which is concerning for an ectopic pregnancy. I reported this finding to Dr. Cleatus who reviewed chart. Patient has follow up with provider scheduled for 11/18/24 and a repeat bhcg 11/16/24 which is appropriate follow up.  Will forward to provider who will see patient and who saw patient also. Rock Skip PEAK

## 2024-11-15 ENCOUNTER — Other Ambulatory Visit: Payer: Self-pay

## 2024-11-15 DIAGNOSIS — O039 Complete or unspecified spontaneous abortion without complication: Secondary | ICD-10-CM

## 2024-11-16 ENCOUNTER — Other Ambulatory Visit: Payer: Self-pay

## 2024-11-17 NOTE — Progress Notes (Unsigned)
° °  GYNECOLOGY PROGRESS NOTE  History:  23 y.o. G3P1011 presents to [facility] office today for problem gyn visit. She reports *****.  She denies h/a, dizziness, shortness of breath, n/v, or fever/chills.    The following portions of the patient's history were reviewed and updated as appropriate: allergies, current medications, past family history, past medical history, past social history, past surgical history and problem list. Last pap smear on *** was normal, *** HRHPV.  Health Maintenance Due  Topic Date Due   HPV VACCINES (1 - 3-dose series) Never done   Hepatitis C Screening  Never done   DTaP/Tdap/Td (1 - Tdap) Never done   Hepatitis B Vaccines 19-59 Average Risk (1 of 3 - 19+ 3-dose series) Never done   Cervical Cancer Screening (Pap smear)  Never done   Influenza Vaccine  Never done   COVID-19 Vaccine (1 - 2025-26 season) Never done     Review of Systems:  Pertinent items are noted in HPI.   Objective:  Physical Exam Last menstrual period 10/04/2024. VS reviewed, nursing note reviewed,  Constitutional: well developed, well nourished, no distress HEENT: normocephalic CV: normal rate Pulm/chest wall: normal effort Breast Exam: deferred Abdomen: soft Neuro: alert and oriented x 3 Skin: warm, dry Psych: affect normal Pelvic exam: Cervix pink, visually closed, without lesion, scant white creamy discharge, vaginal walls and external genitalia normal Bimanual exam: Cervix 0/long/high, firm, anterior, neg CMT, uterus nontender, nonenlarged, adnexa without tenderness, enlargement, or mass  Assessment & Plan:  1. Miscarriage at 8 to [redacted] weeks gestation (Primary) ***  2. Acute cystitis without hematuria ***   No follow-ups on file.   Camie DELENA Rote, CNM 9:35 AM

## 2024-11-18 ENCOUNTER — Ambulatory Visit: Payer: Self-pay | Admitting: Certified Nurse Midwife

## 2024-11-23 ENCOUNTER — Telehealth: Payer: Self-pay

## 2024-11-23 NOTE — Telephone Encounter (Addendum)
 Attempted to contact pt by calling number  [phone] listed in her chart, a woman answered and stated that she is not Angelica and we have the wrong number.  MyChart message sent to patient.   Waddell, RN  ----- Message from Camie DELENA Rote sent at 11/22/2024  3:58 PM EST ----- Patient with pathology concerning for ectopic. Please contact her with interpreter to let her know she needs follow up. She may present to MAU if unable to come to [facility] during hours, but she needs to be seen for Hcg and US .   Thank you, Camie

## 2024-11-24 ENCOUNTER — Telehealth: Payer: Self-pay | Admitting: Family Medicine

## 2024-11-24 NOTE — Telephone Encounter (Signed)
 Called patient and was sent to voicemail 3x. Left message for patient letting her know it is important that she gives us  a call back so that we can reschedule the last visit that she missed. Left office number for patient to call.

## 2024-11-26 NOTE — Telephone Encounter (Signed)
 Patient called to reschedule her missed appointment. I informed her we would not have anything available until February. I told her I could place her on the wait list, but she declined and said she would call around to see if she could get something sooner.

## 2024-12-20 ENCOUNTER — Ambulatory Visit: Payer: Self-pay | Admitting: Obstetrics and Gynecology

## 2024-12-20 ENCOUNTER — Encounter: Payer: Self-pay | Admitting: Obstetrics and Gynecology

## 2024-12-20 VITALS — BP 107/69 | HR 84 | Ht 59.0 in | Wt 139.0 lb

## 2024-12-20 DIAGNOSIS — O039 Complete or unspecified spontaneous abortion without complication: Secondary | ICD-10-CM | POA: Diagnosis not present

## 2024-12-20 NOTE — Progress Notes (Signed)
 24 yo P1021 with recent diagnosis of a spontaneous miscarriage on 11/07/24 presenting for follow up. Patient reports feeling well. Bleeding has stopped and she has not had a period yet. She reports unprotected intercourse since her miscarriage. She denies pelvic pain or abnormal discharge  Past Medical History:  Diagnosis Date   Allergy    Urinary tract infection    Past Surgical History:  Procedure Laterality Date   CESAREAN SECTION N/A 03/30/2018   Procedure: CESAREAN SECTION;  Surgeon: Nicholaus Burnard HERO, MD;  Location: [facility];  Service: Obstetrics;  Laterality: N/A;   Family History  Problem Relation Age of Onset   Healthy Mother    Healthy Father    Social History   Socioeconomic History   Marital status: Single    Spouse name: Not on file   Number of children: Not on file   Years of education: Not on file   Highest education level: Not on file  Occupational History   Not on file  Tobacco Use   Smoking status: Never   Smokeless tobacco: Never  Vaping Use   Vaping status: Never Used  Substance and Sexual Activity   Alcohol use: No   Drug use: No   Sexual activity: Not Currently  Other Topics Concern   Not on file  Social History Narrative   Not on file   Social Drivers of Health   Tobacco Use: Low Risk (12/20/2024)   Patient History    Smoking Tobacco Use: Never    Smokeless Tobacco Use: Never    Passive Exposure: Not on file  Financial Resource Strain: Not on file  Food Insecurity: Not on file  Transportation Needs: Not on file  Physical Activity: Not on file  Stress: Not on file  Social Connections: Not on file  Depression (EYV7-0): Not on file  Alcohol Screen: Not on file  Housing: Not on file  Utilities: Not on file  Health Literacy: Not on file   ROS See pertinent in HPI. All other systems reviewed and non contributory Blood pressure 107/69, pulse 84, height 4' 11 (1.499 m), weight 139 lb (63 kg), last menstrual period 10/04/2024, unknown  if currently breastfeeding. GENERAL: Well-developed, well-nourished female in no acute distress.  NEURO: alert and oriented x 3  A/P 24 yo with recent spontaneous miscarriage - quant HCG today to document complete resolution of pregnancy - Patient does not plan another pregnancy and is interested in [place] IUD. Patient declined IUD placement today - Patient is due for pap smear and will plan to have it done with IUD insertion - Patient advised to pre-medicate with 600 mg ibuprofen  30 minutes before her appointment

## 2024-12-20 NOTE — Progress Notes (Signed)
 24  y.o GYN presents for SAB follow up

## 2024-12-21 ENCOUNTER — Ambulatory Visit: Payer: Self-pay | Admitting: Obstetrics and Gynecology

## 2024-12-21 ENCOUNTER — Encounter: Payer: Self-pay | Admitting: Obstetrics and Gynecology

## 2024-12-21 LAB — BETA HCG QUANT (REF LAB): hCG Quant: 1215 m[IU]/mL

## 2024-12-22 ENCOUNTER — Ambulatory Visit: Admitting: Family Medicine

## 2024-12-23 ENCOUNTER — Other Ambulatory Visit: Payer: Self-pay

## 2024-12-23 ENCOUNTER — Inpatient Hospital Stay (HOSPITAL_COMMUNITY)

## 2024-12-23 ENCOUNTER — Encounter (HOSPITAL_COMMUNITY): Payer: Self-pay | Admitting: Obstetrics and Gynecology

## 2024-12-23 ENCOUNTER — Inpatient Hospital Stay (HOSPITAL_COMMUNITY)
Admission: AD | Admit: 2024-12-23 | Discharge: 2024-12-23 | Disposition: A | Discharge status: Home / Self Care | Attending: Obstetrics & Gynecology | Admitting: Obstetrics & Gynecology

## 2024-12-23 DIAGNOSIS — O200 Threatened abortion: Secondary | ICD-10-CM | POA: Diagnosis not present

## 2024-12-23 DIAGNOSIS — R1030 Lower abdominal pain, unspecified: Secondary | ICD-10-CM | POA: Insufficient documentation

## 2024-12-23 DIAGNOSIS — Z3A11 11 weeks gestation of pregnancy: Secondary | ICD-10-CM | POA: Insufficient documentation

## 2024-12-23 DIAGNOSIS — O3680X0 Pregnancy with inconclusive fetal viability, not applicable or unspecified: Secondary | ICD-10-CM | POA: Diagnosis not present

## 2024-12-23 DIAGNOSIS — O26891 Other specified pregnancy related conditions, first trimester: Secondary | ICD-10-CM | POA: Insufficient documentation

## 2024-12-23 DIAGNOSIS — O209 Hemorrhage in early pregnancy, unspecified: Secondary | ICD-10-CM | POA: Diagnosis present

## 2024-12-23 DIAGNOSIS — R109 Unspecified abdominal pain: Secondary | ICD-10-CM | POA: Diagnosis present

## 2024-12-23 DIAGNOSIS — O26899 Other specified pregnancy related conditions, unspecified trimester: Secondary | ICD-10-CM

## 2024-12-23 LAB — URINALYSIS, ROUTINE W REFLEX MICROSCOPIC

## 2024-12-23 LAB — ABO/RH: ABO/RH(D): O POS

## 2024-12-23 LAB — URINALYSIS, MICROSCOPIC (REFLEX): RBC / HPF: >50 RBC/hpf (ref 0–5)

## 2024-12-23 LAB — CBC
HCT: 40.2 % (ref 36.0–46.0)
Hemoglobin: 14.4 g/dL (ref 12.0–15.0)
MCH: 30.4 pg (ref 26.0–34.0)
MCHC: 35.8 g/dL (ref 30.0–36.0)
MCV: 85 fL (ref 80.0–100.0)
Platelets: 335 K/uL (ref 150–400)
RBC: 4.73 MIL/uL (ref 3.87–5.11)
RDW: 12.6 % (ref 11.5–15.5)
WBC: 11.1 K/uL — ABNORMAL HIGH (ref 4.0–10.5)
nRBC: 0 % (ref 0.0–0.2)

## 2024-12-23 LAB — HCG, QUANTITATIVE, PREGNANCY: hCG, Beta Chain, Quant, S: 2221 m[IU]/mL — ABNORMAL HIGH

## 2024-12-23 MED ORDER — KETOROLAC TROMETHAMINE 10 MG PO TABS: 10.0000 mg | ORAL_TABLET | Freq: Four times a day (QID) | ORAL | 0 refills | Status: AC | PRN

## 2024-12-23 MED ORDER — OXYCODONE HCL 5 MG PO CAPS: 5.0000 mg | ORAL_CAPSULE | ORAL | 0 refills | Status: AC | PRN

## 2024-12-23 MED ORDER — HYDROMORPHONE HCL 1 MG/ML IJ SOLN
0.5000 mg | INTRAMUSCULAR | Status: DC | PRN
  Administered 2024-12-23 (×2): 0.5 mg via INTRAMUSCULAR
  Filled 2024-12-23 (×2): qty 1

## 2024-12-23 MED ORDER — ACETAMINOPHEN 500 MG PO TABS
1000.0000 mg | ORAL_TABLET | Freq: Once | ORAL | Status: AC
  Administered 2024-12-23: 1000 mg via ORAL
  Filled 2024-12-23: qty 2

## 2024-12-23 MED ORDER — KETOROLAC TROMETHAMINE 60 MG/2ML IM SOLN
60.0000 mg | Freq: Once | INTRAMUSCULAR | Status: AC
  Administered 2024-12-23: 60 mg via INTRAMUSCULAR
  Filled 2024-12-23: qty 2

## 2024-12-23 MED ORDER — PROMETHAZINE HCL 25 MG PO TABS: 25.0000 mg | ORAL_TABLET | Freq: Four times a day (QID) | ORAL | 0 refills | Status: AC | PRN

## 2024-12-23 NOTE — MAU Provider Note (Cosign Needed Addendum)
 Chief Complaint:  Cramping and Spotting   HPI   None     Alexandria [name] is a 24 y.o. [id number] at Unknown who presents to maternity admissions reporting vaginal spotting and lower abdominal pain since 3pm. States that she went to the bathroom to have a bowel movement and noted blood while wiping. She denies any blood on the stool or blood dripping into the toilet. Soon after, she started having lower abdominal cramping. She had this cramping before when she had a miscarriage and was concerned, so she came into the MAU for evaluation.  Pregnancy Course: Recently had an appointment on 12/20/24 for SAB follow-up. hCG at that time was 1215.   Past Medical History:  Diagnosis Date   Allergy    Urinary tract infection    OB History  Gravida Para Term Preterm AB Living  4 1 1  1 1   SAB IAB Ectopic Multiple Live Births  1   0 1    # Outcome Date GA Lbr Len/2nd Weight Sex Type Anes PTL Lv  4 Current           3 SAB 2024          2 Term 03/30/18 [redacted]w[redacted]d  3800 g M CS-LTranv Spinal  LIV  1 Gravida            Past Surgical History:  Procedure Laterality Date   CESAREAN SECTION N/A 03/30/2018   Procedure: CESAREAN SECTION;  Surgeon: Nicholaus Burnard HERO, MD;  Location: [facility] BIRTHING SUITES;  Service: Obstetrics;  Laterality: N/A;   Family History  Problem Relation Age of Onset   Healthy Mother    Healthy Father    Social History[1] Allergies[2] Medications Prior to Admission  Medication Sig Dispense Refill Last Dose/Taking   ibuprofen  (ADVIL ) 800 MG tablet Take 1 tablet (800 mg total) by mouth every 8 (eight) hours as needed for moderate pain (pain score 4-6) (Pain). 21 tablet 0    Multiple Vitamins-Minerals (MULTIPLE VITAMINS/WOMENS PO) Take 1 tablet by mouth daily.      nitrofurantoin , macrocrystal-monohydrate, (MACROBID ) 100 MG capsule Take 1 capsule (100 mg total) by mouth 2 (two) times daily. 10 capsule 0     I have reviewed patient's Past Medical Hx, Surgical Hx, Family Hx, Social  Hx, medications and allergies.   ROS  Pertinent items noted in HPI and remainder of comprehensive ROS otherwise negative.   PHYSICAL EXAM  Patient Vitals for the past 24 hrs:  BP Temp Temp src Pulse Resp SpO2  12/23/24 1855 -- -- -- -- -- 98 %  12/23/24 1851 109/62 -- -- 62 16 --  12/23/24 1529 102/66 99 F (37.2 C) Oral 72 20 99 %    Constitutional: Well-developed, well-nourished female in no acute distress.  Cardiovascular: normal rate & rhythm, warm and well-perfused Respiratory: normal effort, no problems with respiration noted GI: Abd soft, non-tender, non-distended MS: Extremities nontender, no edema, normal ROM Neurologic: Alert and oriented x 4.  Pelvic: External genitalia normal, SE: vaginal walls pink and well rugated, cervix is smooth, pink, no lesions, large amount of bright, red blood removed using several large cotton-tipped swabs -- cervix visually closed. Digital exam deferred   Labs: Results for orders placed or performed during the hospital encounter of 12/23/24 (from the past 24 hours)  CBC     Status: Abnormal   Collection Time: 12/23/24  4:07 PM  Result Value Ref Range   WBC 11.1 (H) 4.0 - 10.5 K/uL   RBC  4.73 3.87 - 5.11 MIL/uL   Hemoglobin 14.4 12.0 - 15.0 g/dL   HCT 59.7 63.9 - 53.9 %   MCV 85.0 80.0 - 100.0 fL   MCH 30.4 26.0 - 34.0 pg   MCHC 35.8 30.0 - 36.0 g/dL   RDW 87.3 88.4 - 84.4 %   Platelets 335 150 - 400 K/uL   nRBC 0.0 0.0 - 0.2 %  ABO/Rh     Status: None   Collection Time: 12/23/24  4:07 PM  Result Value Ref Range   ABO/RH(D) O POS    No rh immune globuloin      NOT A RH IMMUNE GLOBULIN CANDIDATE, PT RH POSITIVE Performed at [facility], [address], [place], [place] [address]   hCG, quantitative, pregnancy     Status: Abnormal   Collection Time: 12/23/24  4:07 PM  Result Value Ref Range   hCG, Beta Chain, Quant, S 2,221 (H) <5 mIU/mL  Urinalysis, Routine w reflex microscopic -Urine, Clean Catch     Status: Abnormal    Collection Time: 12/23/24  8:41 PM  Result Value Ref Range   Color, Urine RED (A) YELLOW   APPearance TURBID (A) CLEAR   Specific Gravity, Urine  1.005 - 1.030    TEST NOT REPORTED DUE TO COLOR INTERFERENCE OF URINE PIGMENT   pH  5.0 - 8.0    TEST NOT REPORTED DUE TO COLOR INTERFERENCE OF URINE PIGMENT   Glucose, UA (A) NEGATIVE mg/dL    TEST NOT REPORTED DUE TO COLOR INTERFERENCE OF URINE PIGMENT   Hgb urine dipstick (A) NEGATIVE    TEST NOT REPORTED DUE TO COLOR INTERFERENCE OF URINE PIGMENT   Bilirubin Urine (A) NEGATIVE    TEST NOT REPORTED DUE TO COLOR INTERFERENCE OF URINE PIGMENT   Ketones, ur (A) NEGATIVE mg/dL    TEST NOT REPORTED DUE TO COLOR INTERFERENCE OF URINE PIGMENT   Protein, ur (A) NEGATIVE mg/dL    TEST NOT REPORTED DUE TO COLOR INTERFERENCE OF URINE PIGMENT   Nitrite (A) NEGATIVE    TEST NOT REPORTED DUE TO COLOR INTERFERENCE OF URINE PIGMENT   Leukocytes,Ua (A) NEGATIVE    TEST NOT REPORTED DUE TO COLOR INTERFERENCE OF URINE PIGMENT  Urinalysis, Microscopic (reflex)     Status: Abnormal   Collection Time: 12/23/24  8:41 PM  Result Value Ref Range   RBC / HPF >50 0 - 5 RBC/hpf   WBC, UA 6-10 0 - 5 WBC/hpf   Bacteria, UA FEW (A) NONE SEEN   Squamous Epithelial / HPF 6-10 0 - 5 /HPF   Mucus PRESENT    Amorphous Crystal PRESENT     Imaging:  US  OB LESS THAN 14 WEEKS WITH OB TRANSVAGINAL Result Date: 12/23/2024 EXAM: OBSTETRIC ULTRASOUND FIRST TRIMESTER TECHNIQUE: Transvaginal first trimester obstetric pelvic duplex ultrasound was performed with real-time imaging, color flow Doppler imaging, and spectral analysis. COMPARISON: None available. CLINICAL HISTORY: gestational age by last menstrual: 11 weeks and 3 days. Estimated due date by last menstrual: 07/11/2025. Last menstrual: 10/04/2024 FINDINGS: UTERUS: Question septated uterus. The endometrium measures up to 29 mm. Trace mobile fluid within the endometrial canal is noted. No focal myometrial mass. GESTATIONAL  SAC(S): No intrauterine gestational sac. YOLK SAC: No yolk sac. EMBRYO(<11WK) /FETUS(>=11WK): No embryo. CROWN RUMP LENGTH: Not measured RATE OF CARDIAC ACTIVITY: No cardiac activity. RIGHT OVARY: Unremarkable. No adnexal mass. LEFT OVARY: Unremarkable. No adnexal mass. FREE FLUID: No free fluid. MEASUREMENTS ESTIMATED GESTATIONAL AGE BY CURRENT ULTRASOUND: Not  applicable as no intrauterine pregnancy is identified. ESTIMATED GESTATIONAL AGE BY LMP/PRIOR ULTRASOUND: 11 weeks and 3 days. ESTIMATED DUE DATE: 07/11/2025. IMPRESSION: 1. Non-localization of the pregnancy on this scan. No intrauterine gestational sac. No abnormal ovarian or adnexal masses. Differential diagnosis includes intrauterine gestation too early to visualize, spontaneous abortion, or occult ectopic gestation. Recommend close clinical follow-up and serial serum beta-HCG monitoring, with repeat obstetric scan as warranted by beta-HCG levels and clinical assessment. (Ref.: J Vasc Surg. 2018; 67:2-77 and J Am Coll Radiol 2013;10(10):789-794.) 2. Question septate uterus. 3. Thickened endometrium up to 23mm. Electronically signed by: Morgane Naveau MD 12/23/2024 05:53 PM EST RP Workstation: [id number]    MDM & MAU COURSE  MDM: Moderate  MAU Course: Orders Placed This Encounter  Procedures   US  OB LESS THAN 14 WEEKS WITH OB TRANSVAGINAL   Urinalysis, Routine w reflex microscopic -Urine, Clean Catch   CBC   hCG, quantitative, pregnancy   Urinalysis, Microscopic (reflex)   ABO/Rh   Discharge patient Discharge disposition: 01-Home or Self Care; Discharge patient date: 12/23/2024   Meds ordered this encounter  Medications   acetaminophen  (TYLENOL ) tablet 1,000 mg   HYDROmorphone  (DILAUDID ) injection 0.5 mg   ketorolac  (TORADOL ) injection 60 mg   ketorolac  (TORADOL ) 10 MG tablet    Sig: Take 1 tablet (10 mg total) by mouth every 6 (six) hours as needed for moderate pain (pain score 4-6).    Dispense:  20 tablet    Refill:  0     Supervising Provider:   PRATT, TANYA S [[id number]]   oxycodone  (OXY-IR) 5 MG capsule    Sig: Take 1 capsule (5 mg total) by mouth every 4 (four) hours as needed for up to 3 days (breakthrough pain).    Dispense:  18 capsule    Refill:  0    Supervising Provider:   PRATT, TANYA S [[id number]]   promethazine  (PHENERGAN ) 25 MG tablet    Sig: Take 1 tablet (25 mg total) by mouth every 6 (six) hours as needed.    Dispense:  30 tablet    Refill:  0    Supervising Provider:   PRATT, TANYA S [[id number]]   VSS. Exam unremarkable. Given tylenol  for pain. Ordered repeat hCG, CBC, ABO/Rh, and ultrasound for evaluation. Patient signed out to Ala Cart CNM at 1800.  Charlie Courts, MD  Family Medicine - Obstetrics Fellow   Reassessment @ 2040: Patient reports the Dilaudid  helped, but the pain is coming back. My uterus is contracting. Advised will order something else for pain. Toradol  60 mg IM injection ordered, NAdine, RN made aware of new order. Reassessment @ 2145: Pain improving, but not as well as after Dilaudid . Reviewed lab results and explained POC.     ASSESSMENT/PLAN  1. Pregnancy of unknown anatomic location (Primary) - Explained that pregnancy could more than likely be a miscarriage, but unable to dx at this time  2. Vaginal bleeding affecting early pregnancy - Information provided on vaginal bleeding in pregnancy - Return to MAU: If you have heavier bleeding that soaks through more that 2 pads per hour for an hour or more If you bleed so much that you feel like you might pass out or you do pass out If you have significant abdominal pain that is not improved with Tylenol  1000 mg every 8 hours as needed for pain If you develop a fever > 100.5    3. Abdominal pain affecting pregnancy - Information provided on abdominal pain in pregnancy  4. Threatened miscarriage in early pregnancy - Information provided on threatened miscarriage   5. [redacted] weeks gestation of pregnancy   - Discharge home  in stable condition with return precautions.  - Keep scheduled appt with MAU on 12/25/2024 - Patient verbalized an understanding of the plan of care and agrees.      Follow-up Information     [facility]. Go on 12/25/2024.   Specialty: Obstetrics and Gynecology Why: lab/blood work - repeat beta HCG Contact information: [address] [place]  [address] [phone]              Ala Cart, EDDY  12/23/2024 10:39 PM            [1]  Social History Tobacco Use   Smoking status: Never   Smokeless tobacco: Never  Vaping Use   Vaping status: Never Used  Substance Use Topics   Alcohol use: No   Drug use: No  [2]  Allergies Allergen Reactions   Fluoride Anaphylaxis   Pineapple Anaphylaxis    Throat closing    Other Swelling    cilantro

## 2024-12-23 NOTE — MAU Note (Addendum)
 Alexandria [name] is a 24 y.o. at [redacted]w[redacted]d here in MAU via EMS reporting: she began cramping and spotting 2 hours ago.  Reports hasn't taken any meds to treat.  LMP: 10/04/2024 Onset of complaint: today Pain score: 10 Vitals:   12/23/24 1529  BP: 102/66  Pulse: 72  Resp: 20  Temp: 99 F (37.2 C)  SpO2: 99%     FHT: NA  Lab orders placed from triage: UA

## 2024-12-23 NOTE — Discharge Instructions (Signed)
 Return to MAU: If you have heavier bleeding that soaks through more that 2 pads per hour for an hour or more If you bleed so much that you feel like you might pass out or you do pass out If you have significant abdominal pain that is not improved with Tylenol 1000 mg every 8 hours as needed for pain If you develop a fever > 100.5

## 2024-12-25 ENCOUNTER — Other Ambulatory Visit: Payer: Self-pay

## 2024-12-25 ENCOUNTER — Telehealth: Payer: Self-pay | Admitting: Obstetrics and Gynecology

## 2024-12-25 ENCOUNTER — Inpatient Hospital Stay (HOSPITAL_COMMUNITY)
Admission: AD | Admit: 2024-12-25 | Discharge: 2024-12-25 | Disposition: A | Discharge status: Home / Self Care | Payer: Self-pay | Attending: Obstetrics & Gynecology | Admitting: Obstetrics & Gynecology

## 2024-12-25 ENCOUNTER — Encounter (HOSPITAL_COMMUNITY): Payer: Self-pay | Admitting: Obstetrics & Gynecology

## 2024-12-25 DIAGNOSIS — O209 Hemorrhage in early pregnancy, unspecified: Secondary | ICD-10-CM | POA: Diagnosis present

## 2024-12-25 DIAGNOSIS — Z3A11 11 weeks gestation of pregnancy: Secondary | ICD-10-CM | POA: Diagnosis not present

## 2024-12-25 LAB — HCG, QUANTITATIVE, PREGNANCY: hCG, Beta Chain, Quant, S: 230 m[IU]/mL — ABNORMAL HIGH

## 2024-12-25 NOTE — MAU Note (Signed)
 Alexandria [name] is a 24 y.o. at [redacted]w[redacted]d here in MAU reporting: here for repeat blood work. Vaginal bleeding that has continued and states it is like a period. Few small lots present occasionally. Denies any pain currently. Denies any N/V/D. States she is feeling well.   Pain score: 0 Vitals:   12/25/24 1830  BP: 102/61  Pulse: 77  Resp: 16  Temp: 97.8 F (36.6 C)  SpO2: 98%     FHT:n/a Lab orders placed from triage:  bhcg

## 2024-12-25 NOTE — MAU Provider Note (Signed)
 History   Chief Complaint:  Follow-up   Alexandria [name] is  24 y.o. [id number] Patient's last menstrual period was 10/04/2024.[name] Patient is here for follow up of quantitative HCG and ongoing surveillance of pregnancy status. She is [redacted]w[redacted]d weeks gestation by LMP.    Since her last visit, the patient is without new complaint. The patient reports bleeding as  like a period with occasional few blood clots.  She denies any pain.  General ROS:  positive for vaginal bleeding  Her previous Quantitative HCG values are:  Recent Labs  Lab 12/23/24 1607  HCGBETAQNT 2,221*    Physical Exam   Blood pressure 102/61, pulse 77, temperature 97.8 F (36.6 C), temperature source Oral, resp. rate 16, last menstrual period 10/04/2024, SpO2 98%, unknown if currently breastfeeding.  Focused Gynecological Exam: examination not indicated  Labs: Results for orders placed or performed during the hospital encounter of 12/25/24 (from the past 24 hours)  hCG, quantitative, pregnancy   Collection Time: 12/25/24  6:55 PM  Result Value Ref Range   hCG, Beta Chain, Quant, S 230 (H) <5 mIU/mL    Ultrasound Studies:   US  OB LESS THAN 14 WEEKS WITH OB TRANSVAGINAL Result Date: 12/23/2024 EXAM: OBSTETRIC ULTRASOUND FIRST TRIMESTER TECHNIQUE: Transvaginal first trimester obstetric pelvic duplex ultrasound was performed with real-time imaging, color flow Doppler imaging, and spectral analysis. COMPARISON: None available. CLINICAL HISTORY: gestational age by last menstrual: 11 weeks and 3 days. Estimated due date by last menstrual: 07/11/2025. Last menstrual: 10/04/2024 FINDINGS: UTERUS: Question septated uterus. The endometrium measures up to 29 mm. Trace mobile fluid within the endometrial canal is noted. No focal myometrial mass. GESTATIONAL SAC(S): No intrauterine gestational sac. YOLK SAC: No yolk sac. EMBRYO(<11WK) /FETUS(>=11WK): No embryo. CROWN RUMP LENGTH: Not measured RATE OF CARDIAC ACTIVITY: No cardiac  activity. RIGHT OVARY: Unremarkable. No adnexal mass. LEFT OVARY: Unremarkable. No adnexal mass. FREE FLUID: No free fluid. MEASUREMENTS ESTIMATED GESTATIONAL AGE BY CURRENT ULTRASOUND: Not applicable as no intrauterine pregnancy is identified. ESTIMATED GESTATIONAL AGE BY LMP/PRIOR ULTRASOUND: 11 weeks and 3 days. ESTIMATED DUE DATE: 07/11/2025. IMPRESSION: 1. Non-localization of the pregnancy on this scan. No intrauterine gestational sac. No abnormal ovarian or adnexal masses. Differential diagnosis includes intrauterine gestation too early to visualize, spontaneous abortion, or occult ectopic gestation. Recommend close clinical follow-up and serial serum beta-HCG monitoring, with repeat obstetric scan as warranted by beta-HCG levels and clinical assessment. (Ref.: J Vasc Surg. 2018; 67:2-77 and J Am Coll Radiol 2013;10(10):789-794.) 2. Question septate uterus. 3. Thickened endometrium up to 23mm. Electronically signed by: Morgane Naveau MD 12/23/2024 05:53 PM EST RP Workstation: [id number]    Assessment:   1. Vaginal bleeding in pregnancy, first trimester   2. [redacted] weeks gestation of pregnancy      Plan: -Discharge home in stable condition -Miscarriage precautions discussed -Patient will need to follow-up with [facility] next Thursday or Friday -Patient may return to MAU as needed or if her condition were to change or worsen  Ala Cart, CNM 12/25/2024, 6:57 PM

## 2024-12-25 NOTE — Telephone Encounter (Signed)
 RTC from patient: ID verified by DOB and mailing address.  HCG results given to patient. Explained that HCG dropped from 2,221 down to 230. Will send message to [facility] office to get her scheduled for repeat HCG on Thursday or Friday of next week. Patient verbalized an understanding of the plan of care and agrees.   Alexandria [name], CNM

## 2025-01-06 ENCOUNTER — Ambulatory Visit: Payer: Self-pay | Admitting: Obstetrics and Gynecology

## 2025-01-14 ENCOUNTER — Ambulatory Visit: Payer: Self-pay | Admitting: Family Medicine

## 2025-02-01 ENCOUNTER — Ambulatory Visit: Admitting: Family Medicine
